# Patient Record
Sex: Female | Born: 1955 | Race: White | Hispanic: No | Marital: Single | Smoking: Never smoker
Health system: Southern US, Community
[De-identification: ages and names within clinical notes are randomized; demographics above are authoritative.]

## PROBLEM LIST (undated history)

## (undated) DIAGNOSIS — T7840XA Allergy, unspecified, initial encounter: Secondary | ICD-10-CM

## (undated) DIAGNOSIS — R7303 Prediabetes: Secondary | ICD-10-CM

## (undated) DIAGNOSIS — R55 Syncope and collapse: Secondary | ICD-10-CM

## (undated) DIAGNOSIS — E669 Obesity, unspecified: Secondary | ICD-10-CM

## (undated) DIAGNOSIS — I10 Essential (primary) hypertension: Secondary | ICD-10-CM

## (undated) DIAGNOSIS — G4730 Sleep apnea, unspecified: Secondary | ICD-10-CM

## (undated) DIAGNOSIS — G4733 Obstructive sleep apnea (adult) (pediatric): Principal | ICD-10-CM

## (undated) DIAGNOSIS — E785 Hyperlipidemia, unspecified: Secondary | ICD-10-CM

## (undated) DIAGNOSIS — J302 Other seasonal allergic rhinitis: Secondary | ICD-10-CM

## (undated) DIAGNOSIS — I2510 Atherosclerotic heart disease of native coronary artery without angina pectoris: Secondary | ICD-10-CM

## (undated) DIAGNOSIS — E039 Hypothyroidism, unspecified: Secondary | ICD-10-CM

## (undated) DIAGNOSIS — J40 Bronchitis, not specified as acute or chronic: Secondary | ICD-10-CM

## (undated) DIAGNOSIS — Z9989 Dependence on other enabling machines and devices: Principal | ICD-10-CM

## (undated) HISTORY — DX: Hypothyroidism, unspecified: E03.9

## (undated) HISTORY — DX: Bronchitis, not specified as acute or chronic: J40

## (undated) HISTORY — DX: Other seasonal allergic rhinitis: J30.2

## (undated) HISTORY — DX: Essential (primary) hypertension: I10

## (undated) HISTORY — DX: Syncope and collapse: R55

## (undated) HISTORY — DX: Allergy, unspecified, initial encounter: T78.40XA

## (undated) HISTORY — DX: Sleep apnea, unspecified: G47.30

## (undated) HISTORY — DX: Dependence on other enabling machines and devices: Z99.89

## (undated) HISTORY — PX: CARDIAC CATHETERIZATION: SHX172

## (undated) HISTORY — DX: Prediabetes: R73.03

## (undated) HISTORY — PX: EYE SURGERY: SHX253

## (undated) HISTORY — PX: CARPAL TUNNEL RELEASE: SHX101

## (undated) HISTORY — DX: Hyperlipidemia, unspecified: E78.5

## (undated) HISTORY — DX: Morbid (severe) obesity due to excess calories: E66.01

## (undated) HISTORY — DX: Obesity, unspecified: E66.9

## (undated) HISTORY — DX: Obstructive sleep apnea (adult) (pediatric): G47.33

## (undated) HISTORY — PX: FOOT SURGERY: SHX648

## (undated) HISTORY — PX: OTHER SURGICAL HISTORY: SHX169

## (undated) HISTORY — PX: COLONOSCOPY: SHX174

## (undated) HISTORY — DX: Atherosclerotic heart disease of native coronary artery without angina pectoris: I25.10

---

## 1998-06-01 ENCOUNTER — Other Ambulatory Visit: Admission: RE | Admit: 1998-06-01 | Discharge: 1998-06-01 | Payer: Self-pay

## 1998-08-30 ENCOUNTER — Encounter: Admission: RE | Admit: 1998-08-30 | Discharge: 1998-09-22 | Payer: Self-pay | Admitting: Neurosurgery

## 1999-10-27 ENCOUNTER — Other Ambulatory Visit: Admission: RE | Admit: 1999-10-27 | Discharge: 1999-10-27 | Payer: Self-pay | Admitting: Obstetrics and Gynecology

## 2001-07-10 ENCOUNTER — Other Ambulatory Visit: Admission: RE | Admit: 2001-07-10 | Discharge: 2001-07-10 | Payer: Self-pay | Admitting: Obstetrics & Gynecology

## 2002-07-25 ENCOUNTER — Other Ambulatory Visit: Admission: RE | Admit: 2002-07-25 | Discharge: 2002-07-25 | Payer: Self-pay | Admitting: Obstetrics & Gynecology

## 2003-01-05 ENCOUNTER — Encounter: Admission: RE | Admit: 2003-01-05 | Discharge: 2003-02-16 | Payer: Self-pay | Admitting: Neurosurgery

## 2003-06-12 ENCOUNTER — Ambulatory Visit (HOSPITAL_COMMUNITY): Admission: RE | Admit: 2003-06-12 | Discharge: 2003-06-12 | Payer: Self-pay | Admitting: Cardiology

## 2007-08-13 ENCOUNTER — Ambulatory Visit: Payer: Self-pay | Admitting: Internal Medicine

## 2007-08-27 ENCOUNTER — Ambulatory Visit: Payer: Self-pay | Admitting: Internal Medicine

## 2007-08-28 ENCOUNTER — Telehealth: Payer: Self-pay | Admitting: Internal Medicine

## 2009-03-30 ENCOUNTER — Ambulatory Visit: Payer: Self-pay | Admitting: Cardiovascular Disease

## 2009-03-30 ENCOUNTER — Telehealth: Payer: Self-pay | Admitting: Cardiology

## 2009-03-30 DIAGNOSIS — R0789 Other chest pain: Secondary | ICD-10-CM

## 2009-03-31 ENCOUNTER — Ambulatory Visit: Payer: Self-pay | Admitting: Cardiovascular Disease

## 2009-03-31 ENCOUNTER — Inpatient Hospital Stay (HOSPITAL_BASED_OUTPATIENT_CLINIC_OR_DEPARTMENT_OTHER): Admission: RE | Admit: 2009-03-31 | Discharge: 2009-03-31 | Payer: Self-pay | Admitting: Cardiovascular Disease

## 2009-04-12 ENCOUNTER — Ambulatory Visit: Payer: Self-pay | Admitting: Cardiovascular Disease

## 2009-04-12 DIAGNOSIS — I10 Essential (primary) hypertension: Secondary | ICD-10-CM

## 2009-04-12 DIAGNOSIS — I2510 Atherosclerotic heart disease of native coronary artery without angina pectoris: Secondary | ICD-10-CM

## 2009-06-01 ENCOUNTER — Encounter: Admission: RE | Admit: 2009-06-01 | Discharge: 2009-06-01 | Payer: Self-pay | Admitting: Family Medicine

## 2009-08-04 ENCOUNTER — Ambulatory Visit: Payer: Self-pay | Admitting: Cardiovascular Disease

## 2009-08-11 LAB — CONVERTED CEMR LAB
Alkaline Phosphatase: 54 units/L (ref 39–117)
Bilirubin, Direct: 0.1 mg/dL (ref 0.0–0.3)
LDL Cholesterol: 72 mg/dL (ref 0–99)
Total Bilirubin: 0.4 mg/dL (ref 0.3–1.2)
Total CHOL/HDL Ratio: 2
Triglycerides: 33 mg/dL (ref 0.0–149.0)
VLDL: 6.6 mg/dL (ref 0.0–40.0)

## 2009-12-01 ENCOUNTER — Ambulatory Visit: Payer: Self-pay | Admitting: Cardiovascular Disease

## 2010-03-13 LAB — CONVERTED CEMR LAB
BUN: 13 mg/dL (ref 6–23)
Basophils Absolute: 0.1 10*3/uL (ref 0.0–0.1)
Calcium: 9.2 mg/dL (ref 8.4–10.5)
Chloride: 105 meq/L (ref 96–112)
Creatinine, Ser: 0.76 mg/dL (ref 0.40–1.20)
Eosinophils Absolute: 0.3 10*3/uL (ref 0.0–0.7)
Eosinophils Relative: 4 % (ref 0–5)
Hemoglobin: 13.7 g/dL (ref 12.0–15.0)
Lymphocytes Relative: 40 % (ref 12–46)
MCV: 89.2 fL (ref 78.0–100.0)
Platelets: 261 10*3/uL (ref 150–400)
Prothrombin Time: 12.5 s (ref 11.6–15.2)
RBC: 4.41 M/uL (ref 3.87–5.11)
RDW: 12.4 % (ref 11.5–15.5)
WBC: 7.9 10*3/uL (ref 4.0–10.5)

## 2010-03-17 NOTE — Assessment & Plan Note (Signed)
Summary: left arm and jaw pain, chest discomfort/lwb   Visit Type:  Follow-up Primary Provider:  Dr. Georgina Pillion  CC:  chest pain. Severity of pain 7. Pt takes an Advil to ease pain which helps she states.  History of Present Illness: 55 yo WF with history of HTN and hypothyroidism who began to have left sided and substernal chest pressure two days ago. There was associated nausea and radiation of the pain into the left arm. The arm pain waxed and wained, but the chest discomfort was continuous. She has had on and off chest pressure over the last 36 hours. There has been some jaw pain. She has had no upper respiratory symptoms and denies fever, chills, rigors. No sick contacts but she is exposed to hundreds of children at school. She has been sleeping well at night. She also worked today but has had chest pain over the course of the entire day.   Current Medications (verified): 1)  Levothroid 137 Mcg Tabs (Levothyroxine Sodium) .Marland Kitchen.. 1 Tab By Mouth Once Daily 2)  Lisinopril-Hydrochlorothiazide 10-12.5 Mg Tabs (Lisinopril-Hydrochlorothiazide) .... Tab By Mouth Once Daily 3)  Flonase 50 Mcg/act Susp (Fluticasone Propionate) .... As Needed 4)  Claritin 10 Mg Tabs (Loratadine) .... As Needed 5)  Etodolac 500 Mg Tabs (Etodolac) .... As Needed  Allergies: 1)  ! 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  The patient denies fatigue, malaise, fever, weight gain/loss, vision loss, decreased hearing, hoarseness, palpitations, shortness of breath,  prolonged cough, wheezing, sleep apnea, coughing up blood, abdominal pain, blood in stool, vomiting, diarrhea, heartburn, incontinence, blood in urine, muscle weakness, joint pain, leg swelling, rash, skin lesions, headache, fainting, dizziness, depression, anxiety, enlarged lymph nodes, easy bruising or bleeding, and environmental allergies.    Vital Signs:  Patient profile:   55 year old female Height:      64 inches Weight:      203 pounds BMI:     34.97 Pulse rate:   67 / minute Resp:     14 per minute BP sitting:   132 / 75  (left arm)  Vitals Entered By: Kem Parkinson (March 30, 2009 4:26 PM)  Physical Exam  General:  General: Well developed, well nourished, NAD HEENT: OP clear, mucus membranes moist SKIN: warm, dry Neuro: No focal deficits Musculoskeletal: Muscle strength 5/5 all ext Psychiatric: Mood and affect normal Neck: No JVD, no carotid bruits, no thyromegaly, no lymphadenopathy. Lungs:Clear bilaterally, no wheezes, rhonci, crackles CV: RRR no murmurs, gallops rubs Abdomen: soft, NT, ND, BS present Extremities: No edema, pulses 2+.    EKG  Procedure date:  03/30/2009  Findings:      NSR, rate 67 bpm. Normal EKG.   Impression & Recommendations:  Problem # 1:  CHEST DISCOMFORT (ICD-786.59) Her chest pain is worrisome for unstable angina. She has  a personal history of HTN and is overweight. She has a strong family history of CAD. Given her classic symptoms and risk factors, I will proceed to a diagnostic left heart cath tomorrow in the outpatient cardiac cath lab. We will  check BMET, CBC, coags today. She is aware of the risks and benefits of the procedure.   Her updated medication list for this problem includes:    Lisinopril-hydrochlorothiazide 10-12.5 Mg Tabs (Lisinopril-hydrochlorothiazide) .Marland Kitchen... Tab by mouth once daily  Orders: EKG w/ Interpretation (93000) TLB-BMP (Basic Metabolic Panel-BMET) (80048-METABOL) TLB-CBC Platelet - w/Differential  (85025-CBCD) TLB-PT (Protime) (85610-PTP) TLB-PTT (85730-PTTL) Cardiac Catheterization (Cardiac Cath)  Patient Instructions: 1)  Your physician recommends that you schedule a follow-up appointment in: 2 weeks 2)  Your physician has requested that you have a cardiac catheterization.  Cardiac catheterization is used to diagnose and/or treat various heart conditions. Doctors may recommend this procedure for a number of different reasons. The most common reason is to evaluate chest pain. Chest pain can be a symptom of coronary artery disease (CAD), and cardiac catheterization can show whether plaque is narrowing or blocking your heart's arteries. This procedure is also used to evaluate the valves, as well as measure the blood flow and oxygen levels in different parts of your heart.  For further information please visit [email or web address]  Please follow instruction sheet, as given.

## 2010-03-17 NOTE — Letter (Signed)
Summary: Cardiac Catheterization Instructions- [facility]  [facility], Main Office  [address]   [place], [place] [address]   Phone: [phone]  Fax: [phone]     03/30/2009 MRN: [id number]  Gina Lara [address] [place], [place]  [address]  Dear Ms. Gina Lara,   You are scheduled for a Cardiac Catheterization on ___February 16, 2011 with Dr.Elizbeth Posa  Please arrive to the 1st floor of the Heart and Vascular Center at [facility] at _10:30 am  on the day of your procedure. Please do not arrive before 6:30 a.m. Call the Heart and Vascular Center at [phone] if you are unable to make your appointmnet. The Code to get into the parking garage under the building is 0900. Take the elevators to the 1st floor. You must have someone to drive you home. Someone must be with you for the first 24 hours after you arrive home. Please wear clothes that are easy to get on and off and wear slip-on shoes. Do not eat or drink after midnight except water with your medications that morning. Bring all your medications and current insurance cards with you.  ___ DO NOT take these medications before your procedure: ________________________________________________________________  ___ Make sure you take your aspirin.  _x__ You may take ALL of your medications with water that morning. ________________________________________________________________________________________________________________________________  ___ DO NOT take ANY medications before your procedure.  ___ Pre-med instructions:  ________________________________________________________________________________________________________________________________  The usual length of stay after your procedure is 2 to 3 hours. This can vary.  If you have any questions, please call the office at the number listed above.   Dossie Arbour, RN, BSN

## 2010-03-17 NOTE — Progress Notes (Signed)
Summary: chest discomfort  Phone Note Call from Patient Call back at [facility] Phone [phone] Call back at cell- [phone]-  school # [phone]   Caller: Patient Reason for Call: Talk to Nurse Details for Reason: Per pt calling,seen ts in 05, c/o chest discomfort, indigestion, arm pain since sunday. some jaw discomfort but not much. not keeping pt up. inform pt that i would seen an important message to nurse.  Initial call taken by: Lorne Skeens,  March 30, 2009 11:35 AM  Follow-up for Phone Call        Left message on cell phone.   Pt called back and states she ate lunch on Sunday and then developed pain in the center of her chest which she thought was indigestion.  She was nauseated the rest of the day and had difficulty sleeping Sunday night.  The pt did go for  a 40 minute walk on Sunday and her symptoms did not change when she exerted herself.  The pt said yesterday and today she has continued to have some nausea, chest discomfort, left arm pain and jaw pain.  The pt's chest discomfort continues to come and go and is located in the center of her chest.   The pt describes this discomfort as a squeezing and pressure.  The pt rates her worst discomfort as a 7/10. The pt has been using Pepto Bismol and Tums OTC without much change in her symptoms. The pt does not notice any change in her symptoms with movement or inspiration.  When the pt presses her chest her symptoms are "a little" worse. Pt denies SOB.  The pt states these are the same symptoms that she was evaluated for in 2005 with the exception of jaw pain which is new. The pt said she has had episodes of chest discomfort and left arm pain for the past 10 years.  The pt did see her PCP Dr Georgina Pillion last week at [facility] (retired last week) and everything was okay.  I discussed this pt with Dr Riley Kill and he would like the pt to see one of his partners today in the office.  The pt has been added to Dr Gibson Ramp schedule today at  4:15. Follow-up by: Julieta Gutting, RN, BSN,  March 30, 2009 12:44 PM  Additional Follow-up for Phone Call Additional follow up Details #1::        Pt aware of appt. Additional Follow-up by: Julieta Gutting, RN, BSN,  March 30, 2009 1:10 PM

## 2010-03-17 NOTE — Assessment & Plan Note (Signed)
Summary: per check out/sf   Visit Type:  6 mo f/u Primary Gina Lara:  Dr. Georgina Pillion   History of Present Illness: 55 yo WF with history of HTN and hypothyroidism who was seen as a new patient on 03/30/09. She had been seen by Dr. Riley Kill remotely. At the first visit, she complained of left sided and substernal chest pressure for two days. There was associated nausea and radiation of the pain into the left arm.  Because of her strong family history, risk factors and story, I arranged a diagnostic left heart cath on 03/31/09. This showed normal LV function with mild non-obstructive CAD.  She is here today for follow up. She has been doing well. No chest pain, SOB. She has gained 15 lbs over last few months. She is now up to 212 lbs. She is not happy about this.   Current Medications (verified): 1)  Levothroid 137 Mcg Tabs (Levothyroxine Sodium) .Marland Kitchen.. 1 Tab By Mouth Once Daily 2)  Lisinopril-Hydrochlorothiazide 10-12.5 Mg Tabs (Lisinopril-Hydrochlorothiazide) .... Tab By Mouth Once Daily 3)  Claritin 10 Mg Tabs (Loratadine) .Marland Kitchen.. 1 Tab Once Daily 4)  Etodolac 500 Mg Tabs (Etodolac) .... As Needed 5)  Aspirin 81 Mg Tbec (Aspirin) .... Take One Tablet By Mouth Daily 6)  Crestor 10 Mg Tabs (Rosuvastatin Calcium) .Marland Kitchen.. 1 Tab At Bedtime 7)  Tylenol Extra Strength 500 Mg Tabs (Acetaminophen) .Marland Kitchen.. 1-2 Tab Q 6 Hour As Needed  Allergies: 1)  ! Amoxicillin  Past History:  Past Medical History: Reviewed history from 04/12/2009 and no changes required. HTN Hypothyroidism CAD-mild non-osbstructive disease by cath 2/11. Seasonal allergies  Social History: Reviewed history from 03/30/2009 and no changes required. 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    Vital Signs:  Patient profile:   55 year old female Height:      64 inches Weight:      211.8 pounds BMI:     36.49 Pulse rate:   64 / minute Pulse rhythm:   regular BP sitting:   122 / 64  (left arm) Cuff size:   large  Vitals Entered By: Danielle Rankin, CMA (December 01, 2009 4:27 PM)  Physical Exam  General:  General: Well developed, well nourished, NAD Musculoskeletal: Muscle strength 5/5 all ext Psychiatric: Mood and affect normal Neck: No JVD,  no thyromegaly, no lymphadenopathy. Lungs:Clear bilaterally, no wheezes, rhonci, crackles CV: RRR no murmurs, gallops rubs Abdomen: soft, NT, ND, BS present Extremities: No edema, pulses 2+.    Impression & Recommendations:  Problem # 1:  CAD, NATIVE VESSEL (ICD-414.01) Mild disease by cath February 2011. She is on ASA and a statin. Doing well. No chest pain. Weight loss plan and exercise reviewed today. She is joining a gym this weekend. Her goal weight at return in one year is 180 pounds.   Her updated medication list for this problem includes:    Lisinopril-hydrochlorothiazide 10-12.5 Mg Tabs (Lisinopril-hydrochlorothiazide) .Marland Kitchen... Tab by mouth once daily    Aspirin 81 Mg Tbec (Aspirin) .Marland Kitchen... Take one tablet by mouth  daily  Problem # 2:  HYPERTENSION, BENIGN (ICD-401.1) BP well controlled.   Her updated medication list for this problem includes:    Lisinopril-hydrochlorothiazide 10-12.5 Mg Tabs (Lisinopril-hydrochlorothiazide) .Marland Kitchen... Tab by mouth once daily    Aspirin 81 Mg Tbec (Aspirin) .Marland Kitchen... Take one tablet by mouth daily  Patient Instructions: 1)  Your physician  recommends that you schedule a follow-up appointment in: 1 year 2)  Your physician recommends that you continue on your current medications as directed. Please refer to the Current Medication list given to you today.

## 2010-03-17 NOTE — Miscellaneous (Signed)
Summary: Orders Update  Clinical Lists Changes  Orders: Added new Test order of T-Basic Metabolic Panel (80048-22910) - Signed Added new Test order of T-CBC w/Diff (85025-10010) - Signed Added new Test order of T-PTT (85730-22010) - Signed Added new Test order of T-Protime, Auto (85610-22000) - Signed 

## 2010-03-17 NOTE — Assessment & Plan Note (Signed)
Summary: per check out/sf   Visit Type:  Follow-up Primary Provider:  Dr. Georgina Pillion  CC:  pt states Gina Lara is still having some cp after eating but says not like before..denies any sob or edema.  History of Present Illness: Gina Lara with history of HTN and hypothyroidism who was seen as a new patient on 03/30/09. Gina Lara had been seen by Dr. Riley Kill remotely. At the first visit, Gina Lara complained of left sided and substernal chest pressure for two days. There was associated nausea and radiation of the pain into the left arm. The arm pain waxed and wained, but the chest discomfort was continuous. There has been some jaw pain. Gina Lara had no upper respiratory symptoms and denied fever, chills, rigors. No sick contacts but Gina Lara is exposed to hundreds of children at school. Gina Lara has been sleeping well at night.   Because of her strong family history, risk factors and story, I arranged a diagnostic left heart cath on 03/31/09. This showed normal LV function with mild non-obstructive CAD. Gina Lara tells me that her pain is much improved. Occasionally, Gina Lara will have arm pain and chest pain after meals. This is sometimes associated with stress. Gina Lara has lost 13 pounds over the last 2 months. Gina Lara is eager to begin eating healthy and exercising.   Current Medications (verified): 1)  Levothroid 137 Mcg Tabs (Levothyroxine Sodium) .Marland Kitchen.. 1 Tab By Mouth Once Daily 2)  Lisinopril-Hydrochlorothiazide 10-12.5 Mg Tabs (Lisinopril-Hydrochlorothiazide) .... Tab By Mouth Once Daily 3)  Flonase 50 Mcg/act Susp (Fluticasone Propionate) .... As Needed 4)  Claritin 10 Mg Tabs (Loratadine) .Marland Kitchen.. 1 Tab Once Daily 5)  Etodolac 500 Mg Tabs (Etodolac) .... As Needed 6)  Aspirin 81 Mg Tbec (Aspirin) .... Take One Tablet By Mouth Daily 7)  Crestor 10 Mg Tabs (Rosuvastatin Calcium) .Marland Kitchen.. 1 Tab At Bedtime 8)  Pepcid 20 Mg Tabs (Famotidine) .Marland Kitchen.. 1 Tab Once Daily  Allergies: 1)  ! Amoxicillin  Past History:  Past Medical  History: HTN Hypothyroidism CAD-mild non-osbstructive disease by cath 2/11. Seasonal allergies  Social History: Reviewed history from 03/30/2009 and no changes required. 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  The patient denies fatigue, malaise, fever, weight gain/loss, vision loss, decreased hearing, hoarseness, palpitations, shortness of breath, prolonged cough, wheezing, sleep apnea, coughing up blood, abdominal pain, blood in stool, nausea, vomiting, diarrhea, heartburn, incontinence, blood in urine, muscle weakness, joint pain, leg swelling, rash, skin lesions, headache, fainting, dizziness, depression, anxiety, enlarged lymph nodes, easy bruising or bleeding, and environmental allergies.    Vital Signs:  Patient profile:   Gina year old female Height:      64 inches Weight:      201 pounds BMI:     34.63 Pulse rate:   76 / minute Pulse rhythm:   regular BP sitting:   110 / 70  (left arm) Cuff size:   large  Vitals Entered By: Danielle Rankin, CMA (April 12, 2009 4:15 PM)  Physical Exam  General:  General: Well developed, well nourished, NAD Psychiatric: Mood and affect normal Neck: No JVD, no carotid bruits, no thyromegaly, no lymphadenopathy. Lungs:Clear bilaterally, no wheezes, rhonci, crackles CV: RRR no murmurs, gallops rubs Abdomen: soft, NT, ND, BS present Extremities: No edema, pulses 2+. Right radial pulse 2+. Right radial cath site clean, well healed. No hematoma.  Cardiac Cath  Procedure date:  03/31/2009  Findings:      1. The left main coronary artery bifurcated into the circumflex and     LAD and had no evidence of disease. 2. The left anterior descending was a moderate-sized vessel that     coursed to the apex and gave off a moderate-sized bifurcating     diagonal branch.  There appeared to be mild  luminal irregularities     throughout the left anterior descending artery.  There appeared to     be a 20% proximal stenosis and 20% serial mid stenosis.  The distal     apical LAD appeared to have diffuse 30% disease.  The diagonal     branch was moderate size and had no obstructive disease. 3. The circumflex artery was a large-caliber vessel that gave off a     small caliber first obtuse marginal branch that was free of     disease.  The mid AV groove circumflex took an acute bend and     appeared to have a 30% stenosis prior to the bend.  Second obtuse     marginal was moderate sized and bifurcating and had no obstructive     disease.  The left-sided posterior descending artery was small     caliber and had no obstructive disease. 4. The right coronary artery was a small nondominant vessel that     appeared to have a 40-50% stenosis in the midportion of the vessel.     This was a very small-caliber vessel. 5. Left ventricular angiogram was performed in the RAO projection and     showed normal left ventricular systolic function with ejection     fraction of 65%.  Impression & Recommendations:  Problem # 1:  CAD, NATIVE VESSEL (ICD-414.01) Mild disease by cath 03/31/09. Chest pain is most likely non-cardiac based on the minimal disease seen during cath. Will continue statin, ace-inhibitor and ASA. Gina Lara will continue Pepcid for possible GERD. Consider PPI in the future if symptoms continue. Gina Lara will begin exercising daily. Our goal for her weight is 175 lbs at the next visit in 6 months.  Will check FLP, LFTs and CK in 10 weeks (Crestor started 03/31/09)  Her updated medication list for this problem includes:    Lisinopril-hydrochlorothiazide 10-12.5 Mg Tabs (Lisinopril-hydrochlorothiazide) .Marland Kitchen... Tab by mouth once daily    Aspirin 81 Mg Tbec (Aspirin) .Marland Kitchen... Take one tablet by mouth daily  Problem # 2:  HYPERTENSION, BENIGN (ICD-401.1) Well controlled on current therapy.   Her updated  medication list for this problem includes:    Lisinopril-hydrochlorothiazide 10-12.5 Mg Tabs (Lisinopril-hydrochlorothiazide) .Marland Kitchen... Tab by mouth once daily    Aspirin 81 Mg Tbec (Aspirin) .Marland Kitchen... Take one tablet by mouth daily  Patient Instructions: 1)  Your physician recommends that you schedule a follow-up appointment in: 6 months 2)  Your physician recommends that you return for a FASTING lipid profile, liver function and CK in mid May. (May 18)

## 2010-05-04 ENCOUNTER — Other Ambulatory Visit: Payer: Self-pay | Admitting: *Deleted

## 2010-05-04 DIAGNOSIS — E785 Hyperlipidemia, unspecified: Secondary | ICD-10-CM

## 2010-05-04 MED ORDER — ROSUVASTATIN CALCIUM 10 MG PO TABS: 10.0000 mg | ORAL_TABLET | Freq: Every day | ORAL | Status: DC

## 2010-08-26 ENCOUNTER — Telehealth: Payer: Self-pay | Admitting: Internal Medicine

## 2010-08-29 ENCOUNTER — Encounter: Payer: Self-pay | Admitting: Internal Medicine

## 2010-08-29 ENCOUNTER — Encounter: Payer: Self-pay | Admitting: *Deleted

## 2010-08-29 NOTE — Telephone Encounter (Signed)
Offered an appointment on 09/12/10 but patient cannot come for OV until after 09/26/10. Scheduled patient on 09/28/10 at 10:15AM. Sue Lush aware and will notify patient. Letter mailed to patient.

## 2010-08-29 NOTE — Telephone Encounter (Signed)
Error

## 2010-09-12 ENCOUNTER — Ambulatory Visit: Payer: Self-pay | Admitting: Internal Medicine

## 2010-09-28 ENCOUNTER — Ambulatory Visit (INDEPENDENT_AMBULATORY_CARE_PROVIDER_SITE_OTHER): Payer: BC Managed Care – PPO | Admitting: Internal Medicine

## 2010-09-28 ENCOUNTER — Encounter: Payer: Self-pay | Admitting: Internal Medicine

## 2010-09-28 VITALS — BP 100/62 | HR 88 | Ht 64.0 in | Wt 296.0 lb

## 2010-09-28 DIAGNOSIS — R933 Abnormal findings on diagnostic imaging of other parts of digestive tract: Secondary | ICD-10-CM

## 2010-09-28 NOTE — Patient Instructions (Signed)
Dr Genia Del

## 2010-09-28 NOTE — Progress Notes (Signed)
Margarette Vannatter [place] 23-Jun-1955 MRN [id number]     History of Present Illness:  This is a 55 year old white female who was found to have a nodule on the rectal exam by [facility] during her routine gynecological exam. She denies any change in bowel habits, rectal bleeding or rectal pain. We did a screening colonoscopy in July 2009 which was a normal exam. Her recall colonoscopy will be in July 2019. She is just getting over acute gastroenteritis precipitated by antibiotics including erythromycin in combination with Ibuprofen  for an ear infection . She was having nausea, vomiting and abdominal pain but she almost 100% improved now.   Past Medical History  Diagnosis Date  . Hypertension   . Hypothyroidism   . Hyperlipidemia   . CAD (coronary artery disease)     mild, non-obstructive  . Seasonal allergies   . Obesity    Past Surgical History  Procedure Date  . Foot surgery   . Eye surgery   . Herniated disk     reports that she has never smoked. She has never used smokeless tobacco. She reports that she drinks alcohol. She reports that she does not use illicit drugs. family history includes Heart attack in her brother, father, and mother. Allergies  Allergen Reactions  . Amoxicillin     REACTION: rash        Review of Systems:  The remainder of the 10  point ROS is negative except as outlined in H&P   Physical Exam: General appearance  Well developed in no distress. Eyes- non icteric. HEENT nontraumatic, normocephalic. Mouth no lesions, tongue papillated, no cheilosis. Neck supple without adenopathy, thyroid not enlarged, no carotid bruits, no JVD. Lungs Clear to auscultation bilaterally. Cor normal S1 normal S2, regular rhythm , no murmur,  quiet precordium. Abdomen Soft obese abdomen. Normal active bowel sounds. No distention. No tenderness.  Rectal:and anoscopic exam reveals small hemorrhoidal tags externally. Normal rectal sphincter tone. Several hypertrophied anal  papillae at dentate line; each measures about 3 mm in length, and feels very firm on digital exam. They don't protrude through the rectum and they are not tender and don't show any stigmata of bleeding The rectal ampulla itself appears normal and there are  no internal hemorrhoids. Stool is Hemoccult negative. Extremities no pedal edema. Skin no lesions. Neurological alert and oriented x 3. Psychological normal mood and affect.  Assessment and Plan:  Problem #1 Rectal nodules within the anal canal at the dentate line represent hypertrophied anal papillae. They are not excessively enlarged and they do not seem to be causing any symptoms. There are no significant hemorrhoids or any other lesions. Stool is Hemoccult negative. I have discussed these findings with the patient and I feel comfortable keeping the recall colonoscopy for a 10 year recall in 2019. We will be happy to see her before that if she develop specific problems.   09/28/2010 Lina Sar

## 2011-05-16 ENCOUNTER — Other Ambulatory Visit: Payer: Self-pay | Admitting: Cardiovascular Disease

## 2011-08-08 IMAGING — CT CT HEAD W/O CM
2 series · 16 of 30 positions shown, 20 images · non-contrast
Comparison: None.

CLINICAL DATA: Fall injury with headache, dizziness, slurred
speech.

CT HEAD WITHOUT CONTRAST
TECHNIQUE: Contiguous axial images were obtained from the base of
the skull through the vertex without contrast.

[Series 2: head w/o · axial · non-contrast · 0.49mm/px · z∈[+14,+144]mm · 13 of 28 slices shown, 17 images]
[im 2/28  brain]
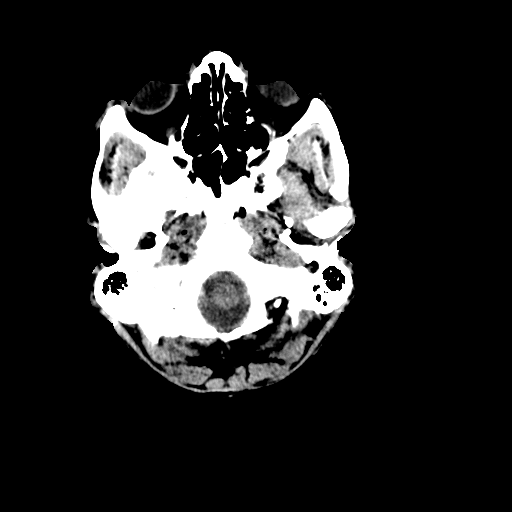
[im 2/28  bone]
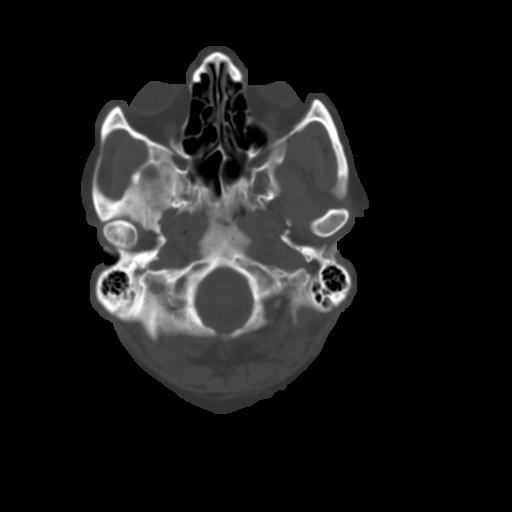
[im 4/28  brain]
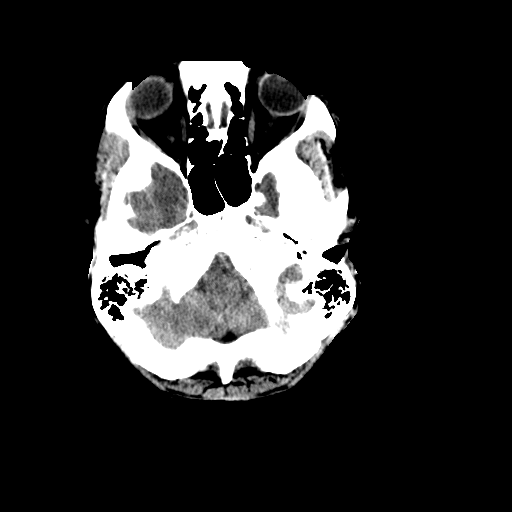
[im 6/28  brain]
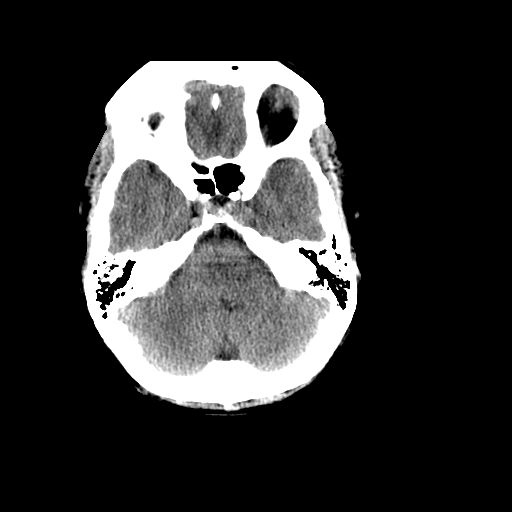
[im 8/28  brain]
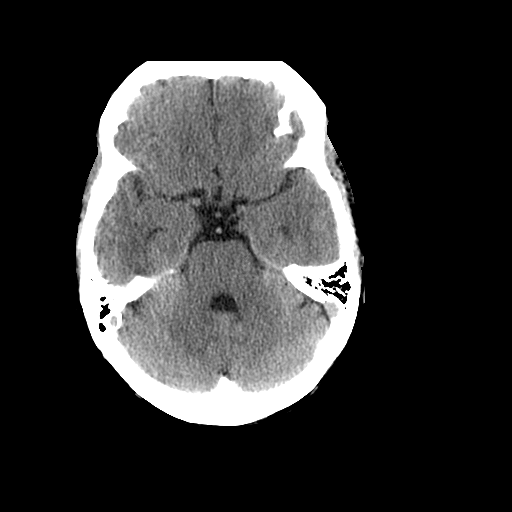
[im 10/28  brain]
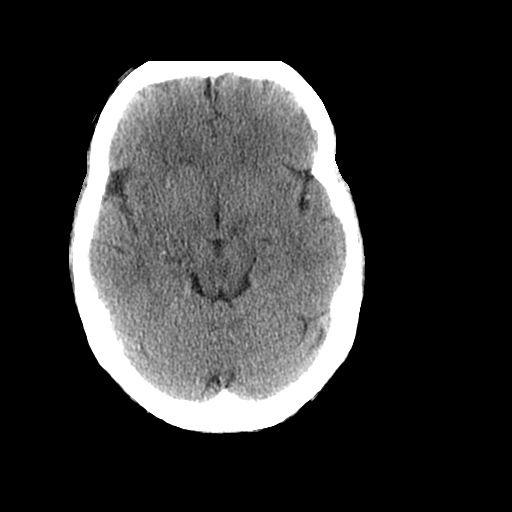
[im 10/28  bone]
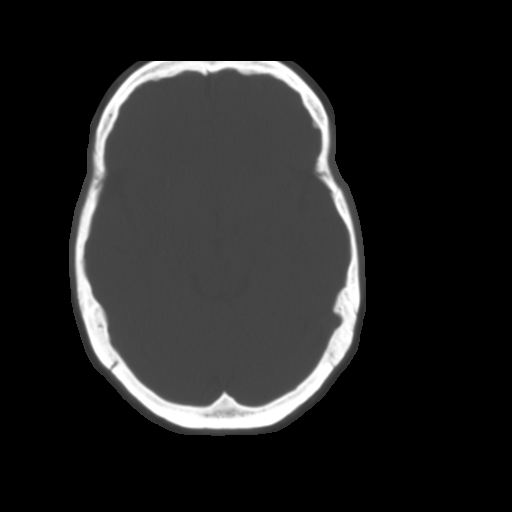
[im 12/28  brain]
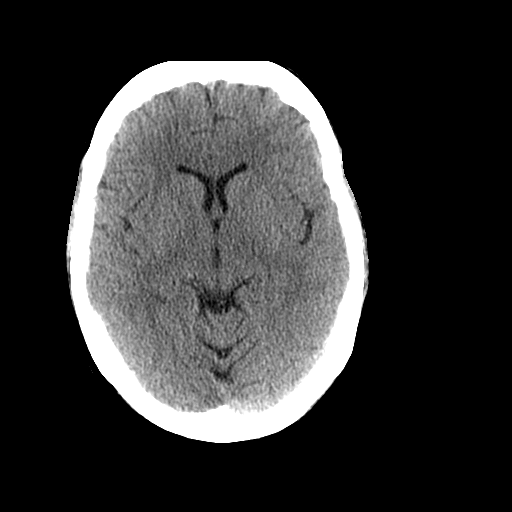
[im 14/28  brain]
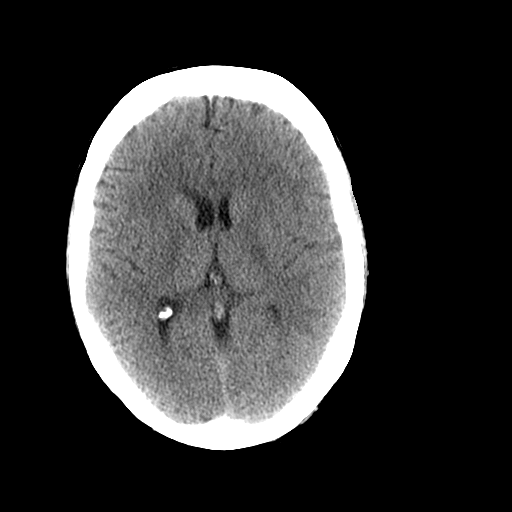
[im 16/28  brain]
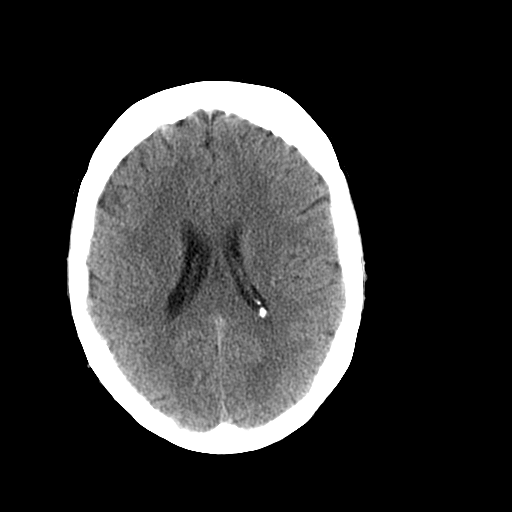
[im 18/28  brain]
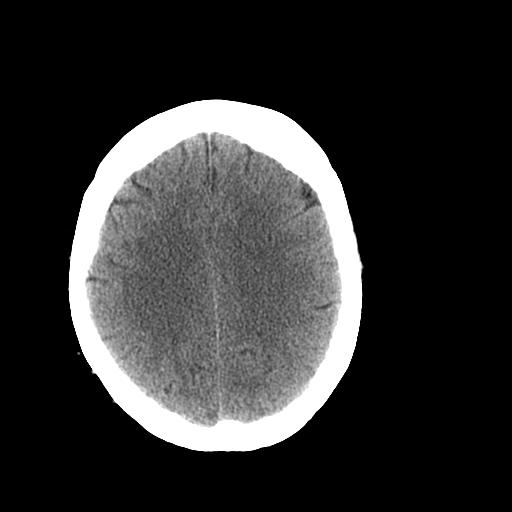
[im 18/28  bone]
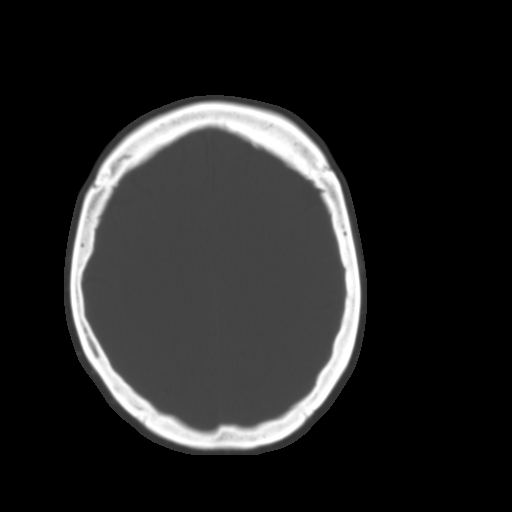
[im 20/28  brain]
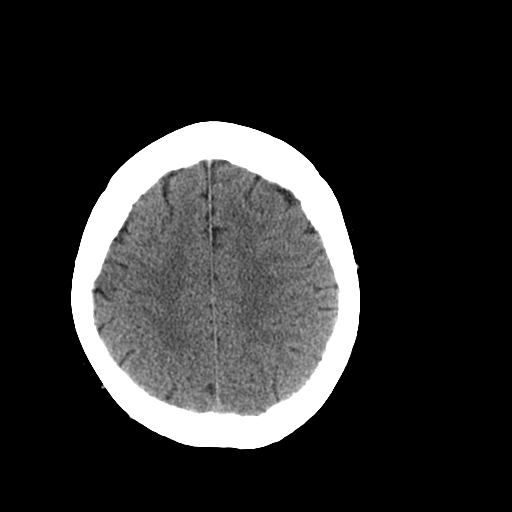
[im 22/28  brain]
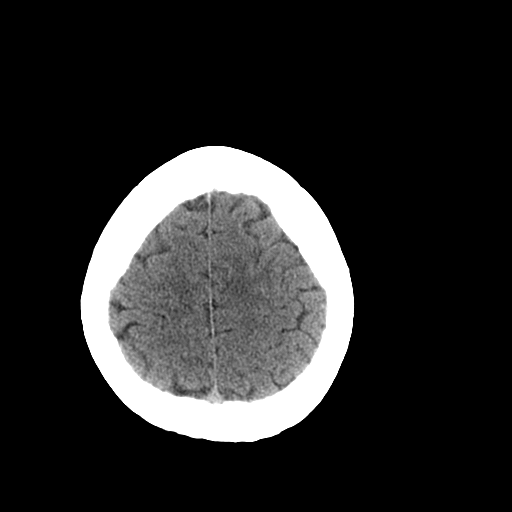
[im 24/28  brain]
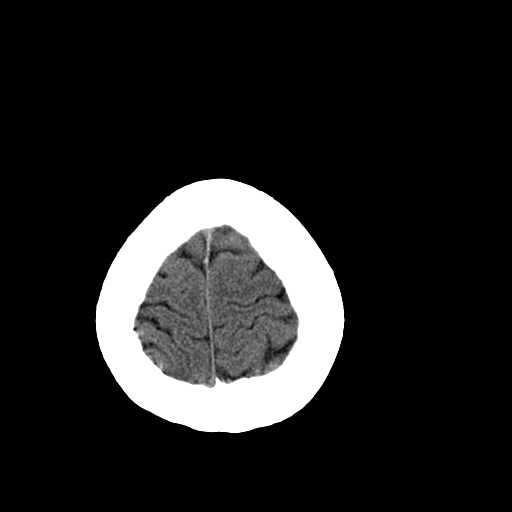
[im 26/28  brain]
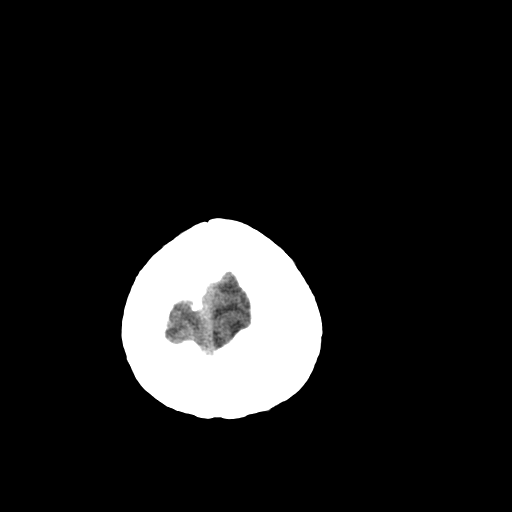
[im 26/28  bone]
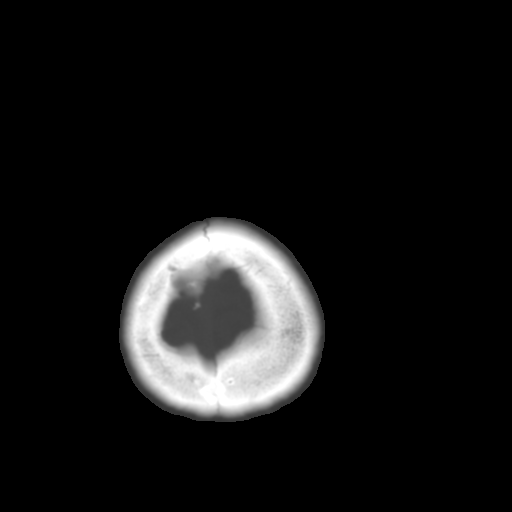

[Series 3: head bone · axial · 0.49mm/px · z∈[+14,+57]mm · 3 of 28 slices shown]
[im 2/28  bone]
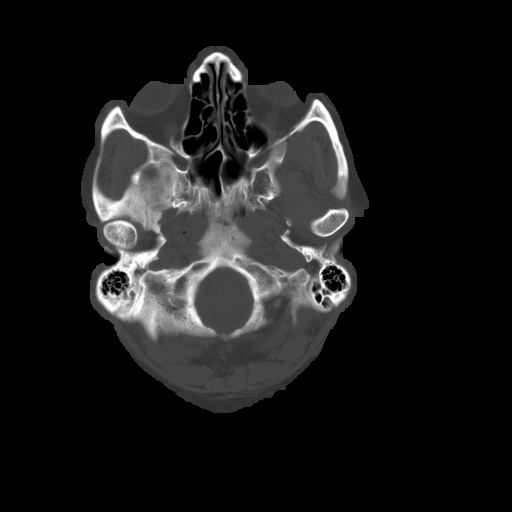
[im 6/28  bone]
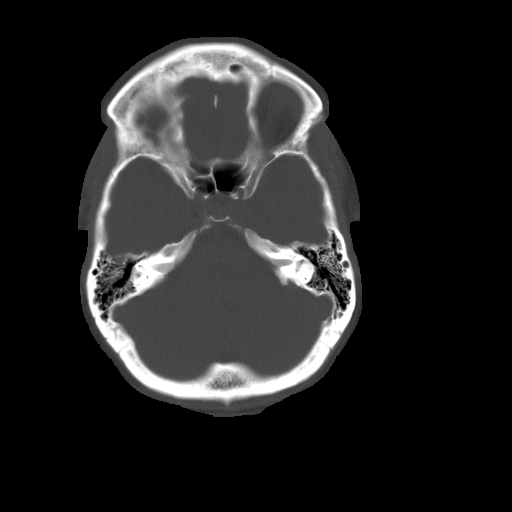
[im 10/28  bone]
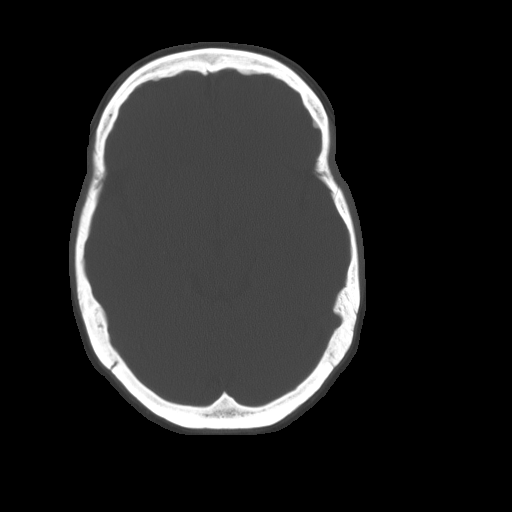

[16 of 30 positions shown; findings below may reference images not displayed]

FINDINGS: The skull appears normal with clear paranasal sinuses and
bilateral mastoid air cells without acute fracture.  Cerebrum,
cerebral ventricles, and cerebellum appear normal for age.  No
acute stroke, acute intracerebral edema, acute intracerebral
hemorrhage, mass lesion, mass effect, acute extra-axial
hemorrhage/collection/tumor seen.
IMPRESSION: Negative.

## 2011-08-19 ENCOUNTER — Other Ambulatory Visit: Payer: Self-pay | Admitting: Cardiovascular Disease

## 2011-08-21 NOTE — Telephone Encounter (Signed)
Pt needs appointment then refill can be made Fax Received. Refill Completed. Gina Lara (R.M.A)   

## 2011-10-18 ENCOUNTER — Telehealth: Payer: Self-pay | Admitting: Cardiovascular Disease

## 2011-10-18 DIAGNOSIS — I2510 Atherosclerotic heart disease of native coronary artery without angina pectoris: Secondary | ICD-10-CM

## 2011-10-18 NOTE — Telephone Encounter (Signed)
Left message to call back  

## 2011-10-18 NOTE — Telephone Encounter (Signed)
Spoke with pt. She last saw Dr. Clifton James in October 2011 and has upcoming appt scheduled with him for December 01, 2011. She has routine lab work done by primary MD but pt states primary MD prefers she have cholesterol checked by her cardiologist.  Appt made for her to have fasting lipid and liver profile on November 27, 2011

## 2011-10-18 NOTE — Telephone Encounter (Signed)
Pt calling to see if labs need to be done prior to appt

## 2011-10-20 ENCOUNTER — Other Ambulatory Visit: Payer: Self-pay | Admitting: Cardiovascular Disease

## 2011-10-26 ENCOUNTER — Ambulatory Visit: Payer: BC Managed Care – PPO | Admitting: Cardiovascular Disease

## 2011-11-27 ENCOUNTER — Other Ambulatory Visit: Payer: BC Managed Care – PPO

## 2011-12-01 ENCOUNTER — Ambulatory Visit: Payer: BC Managed Care – PPO | Admitting: Cardiovascular Disease

## 2011-12-11 ENCOUNTER — Other Ambulatory Visit (INDEPENDENT_AMBULATORY_CARE_PROVIDER_SITE_OTHER): Payer: BC Managed Care – PPO

## 2011-12-11 DIAGNOSIS — I2510 Atherosclerotic heart disease of native coronary artery without angina pectoris: Secondary | ICD-10-CM

## 2011-12-11 LAB — LIPID PANEL
HDL: 46.4 mg/dL (ref >39.00)
LDL Cholesterol: 84 mg/dL (ref 0–99)
Total CHOL/HDL Ratio: 3
Triglycerides: 64 mg/dL (ref 0.0–149.0)
VLDL: 12.8 mg/dL (ref 0.0–40.0)

## 2011-12-11 LAB — HEPATIC FUNCTION PANEL
AST: 18 U/L (ref 0–37)
Total Bilirubin: 0.8 mg/dL (ref 0.3–1.2)

## 2012-01-09 ENCOUNTER — Ambulatory Visit (INDEPENDENT_AMBULATORY_CARE_PROVIDER_SITE_OTHER): Payer: BC Managed Care – PPO | Admitting: Cardiovascular Disease

## 2012-01-09 ENCOUNTER — Encounter: Payer: Self-pay | Admitting: Cardiovascular Disease

## 2012-01-09 VITALS — BP 124/81 | HR 66 | Ht 64.0 in | Wt 210.0 lb

## 2012-01-09 DIAGNOSIS — I2510 Atherosclerotic heart disease of native coronary artery without angina pectoris: Secondary | ICD-10-CM

## 2012-01-09 MED ORDER — ROSUVASTATIN CALCIUM 10 MG PO TABS: 10.0000 mg | ORAL_TABLET | Freq: Every day | ORAL | Status: DC

## 2012-01-09 NOTE — Patient Instructions (Addendum)
Your physician wants you to follow-up in:  6 months. You will receive a reminder letter in the mail two months in advance. If you don't receive a letter, please call our office to schedule the follow-up appointment.   

## 2012-01-09 NOTE — Progress Notes (Signed)
History of Present Illness: 56 yo WF with history of HTN and hypothyroidism who was seen as a new patient on 03/30/09. She had been seen by Dr. Riley Kill remotely. At the first visit, she complained of left sided and substernal chest pressure for two days. There was associated nausea and radiation of the pain into the left arm. Because of her strong family history, risk factors and story, I arranged a diagnostic left heart cath on 03/31/09. This showed normal LV function with mild non-obstructive CAD. She was last seen in our office 12/01/09.   She is here today for follow up. She has been doing well. No chest pain, SOB. She wishes to start exercising. She is trying to eat healthy. She teaches art for elementary school. Going to [place] next month.   Primary Care Physician: Koirala,Dibas  Last Lipid Profile:Lipid Panel     Component Value Date/Time   CHOL 143 12/11/2011 1157   TRIG 64.0 12/11/2011 1157   HDL 46.40 12/11/2011 1157   CHOLHDL 3 12/11/2011 1157   VLDL 12.8 12/11/2011 1157   LDLCALC 84 12/11/2011 1157     Past Medical History  Diagnosis Date  . Hypertension   . Hypothyroidism   . Hyperlipidemia   . CAD (coronary artery disease)     mild, non-obstructive  . Seasonal allergies   . Obesity     Past Surgical History  Procedure Date  . Foot surgery   . Eye surgery   . Herniated disk     Current Outpatient Prescriptions  Medication Sig Dispense Refill  . aspirin 81 MG tablet Take 81 mg by mouth daily.        . CRESTOR 10 MG tablet TAKE 1 TABLET AT BEDTIME  30 tablet  1  . etodolac (LODINE) 500 MG tablet Take 500 mg by mouth daily. As needed for back pain        . levothyroxine (SYNTHROID, LEVOTHROID) 137 MCG tablet Take 137 mcg by mouth daily.        Marland Kitchen lisinopril-hydrochlorothiazide (PRINZIDE,ZESTORETIC) 10-12.5 MG per tablet Take 1 tablet by mouth daily.        Marland Kitchen loratadine (CLARITIN) 10 MG tablet Take 10 mg by mouth daily. As needed        Allergies  Allergen  Reactions  . Amoxicillin     REACTION: rash    History   Social History  . Marital Status: Single    Spouse Name: N/A    Number of Children: N/A  . Years of Education: N/A   Occupational History  . Not on file.   Social History Main Topics  . Smoking status: Never Smoker   . Smokeless tobacco: Never Used  . Alcohol Use: Yes     Comment: rare  . Drug Use: No  . Sexually Active: Not on file   Other Topics Concern  . Not on file   Social History Narrative  . No narrative on file    Family History  Problem Relation Age of Onset  . Heart attack Father   . Heart attack Mother   . Heart attack Brother     Review of Systems:  As stated in the HPI and otherwise negative.   BP 124/81  Pulse 66  Ht 5\' 4"  (1.626 m)  Wt 210 lb (95.255 kg)  BMI 36.05 kg/m2  Physical Examination: General: Well developed, well nourished, NAD HEENT: OP clear, mucus membranes moist SKIN: warm, dry. No rashes. Neuro: No focal deficits Musculoskeletal: Muscle strength  5/5 all ext Psychiatric: Mood and affect normal Neck: No JVD, no carotid bruits, no thyromegaly, no lymphadenopathy. Lungs:Clear bilaterally, no wheezes, rhonci, crackles Cardiovascular: Regular rate and rhythm. No murmurs, gallops or rubs. Abdomen:Soft. Bowel sounds present. Non-tender.  Extremities: No lower extremity edema. Pulses are 2 + in the bilateral DP/PT.  EKG: NSR, rate 64 bpm. Incomplete RBBB.   Assessment and Plan:   1. CAD: Stable. Continue ASA and statin. No beta blocker with bradycardia. She is feeling well. Eager to start back exercising. Goal weight loss over next 6 months is 20 lbs. She is trying to eat healthy foods.

## 2012-01-18 ENCOUNTER — Other Ambulatory Visit: Payer: Self-pay | Admitting: Cardiovascular Disease

## 2012-03-26 ENCOUNTER — Other Ambulatory Visit: Payer: Self-pay | Admitting: Cardiovascular Disease

## 2012-07-10 ENCOUNTER — Ambulatory Visit: Payer: BC Managed Care – PPO | Admitting: Cardiovascular Disease

## 2012-08-13 ENCOUNTER — Telehealth: Payer: Self-pay | Admitting: Cardiovascular Disease

## 2012-08-13 NOTE — Telephone Encounter (Signed)
New Problem:    Patient called in wanting to know if she needed to have her cholesterol and other levels checked prior to her next visit.  Please call back.

## 2012-08-13 NOTE — Telephone Encounter (Signed)
Lipids and Liver profiles checked in October 2013.  I told pt she did not need  lab work at this time.

## 2012-09-10 ENCOUNTER — Ambulatory Visit: Payer: BC Managed Care – PPO | Admitting: Cardiovascular Disease

## 2012-11-11 ENCOUNTER — Telehealth: Payer: Self-pay | Admitting: Cardiovascular Disease

## 2012-11-11 DIAGNOSIS — I2510 Atherosclerotic heart disease of native coronary artery without angina pectoris: Secondary | ICD-10-CM

## 2012-11-11 NOTE — Telephone Encounter (Signed)
Returned call to patient she stated she would like to lab work before appointment with Dr.McAlhany on 11/13/12.Fasting lab for lipid and hepatic panels scheduled for 11/12/12.Message sent to Dr.McAlhany for review.

## 2012-11-11 NOTE — Telephone Encounter (Signed)
New problem    Patient wants to know should she have blood work prior to appt on Wednesday.

## 2012-11-12 ENCOUNTER — Other Ambulatory Visit (INDEPENDENT_AMBULATORY_CARE_PROVIDER_SITE_OTHER): Payer: BC Managed Care – PPO

## 2012-11-12 DIAGNOSIS — I2510 Atherosclerotic heart disease of native coronary artery without angina pectoris: Secondary | ICD-10-CM

## 2012-11-12 LAB — LIPID PANEL
Cholesterol: 127 mg/dL (ref 0–200)
HDL: 45.3 mg/dL (ref >39.00)
LDL Cholesterol: 68 mg/dL (ref 0–99)
Total CHOL/HDL Ratio: 3
Triglycerides: 68 mg/dL (ref 0.0–149.0)

## 2012-11-12 LAB — HEPATIC FUNCTION PANEL: AST: 20 U/L (ref 0–37)

## 2012-11-12 NOTE — Telephone Encounter (Signed)
Agree. Thanks. chris 

## 2012-11-13 ENCOUNTER — Ambulatory Visit (INDEPENDENT_AMBULATORY_CARE_PROVIDER_SITE_OTHER): Payer: BC Managed Care – PPO | Admitting: Cardiovascular Disease

## 2012-11-13 VITALS — BP 128/76 | HR 80 | Resp 12 | Wt 215.0 lb

## 2012-11-13 DIAGNOSIS — I2510 Atherosclerotic heart disease of native coronary artery without angina pectoris: Secondary | ICD-10-CM

## 2012-11-13 MED ORDER — ROSUVASTATIN CALCIUM 10 MG PO TABS: 10.0000 mg | ORAL_TABLET | Freq: Every day | ORAL | Status: DC

## 2012-11-13 NOTE — Patient Instructions (Addendum)
Your physician wants you to follow-up in:  12 months.  You will receive a reminder letter in the mail two months in advance. If you don't receive a letter, please call our office to schedule the follow-up appointment.   

## 2012-11-13 NOTE — Progress Notes (Signed)
History of Present Illness: 57 yo WF with history of HTN and hypothyroidism who is here today for follow up. She was seen as a new patient on 03/30/09. She had been seen by Dr. Riley Kill remotely. At the first visit, she complained of left sided and substernal chest pressure. There was associated nausea and radiation of the pain into the left arm. Because of her strong family history, risk factors and story, I arranged a diagnostic left heart cath on 03/31/09. This showed normal LV function with mild non-obstructive CAD.   She is here today for follow up. She has been doing well. Occasional jaw pain when under stress. No exertional chest pain, SOB. She teaches art for elementary school. She describes job stress, anxiety related to this. Some LE edema during day that resolves at night.   Primary Care Physician: In transition  Last Lipid Profile:Lipid Panel     Component Value Date/Time   CHOL 127 11/12/2012 0818   TRIG 68.0 11/12/2012 0818   HDL 45.30 11/12/2012 0818   CHOLHDL 3 11/12/2012 0818   VLDL 13.6 11/12/2012 0818   LDLCALC 68 11/12/2012 0818     Past Medical History  Diagnosis Date  . Hypertension   . Hypothyroidism   . Hyperlipidemia   . CAD (coronary artery disease)     mild, non-obstructive  . Seasonal allergies   . Obesity     Past Surgical History  Procedure Laterality Date  . Foot surgery    . Eye surgery    . Herniated disk      Current Outpatient Prescriptions  Medication Sig Dispense Refill  . aspirin 81 MG tablet Take 81 mg by mouth daily.        . CRESTOR 10 MG tablet TAKE 1 TABLET AT BEDTIME  30 tablet  1  . etodolac (LODINE) 500 MG tablet Take 500 mg by mouth daily. As needed for back pain        . levothyroxine (SYNTHROID, LEVOTHROID) 137 MCG tablet Take 137 mcg by mouth daily.        Marland Kitchen lisinopril-hydrochlorothiazide (PRINZIDE,ZESTORETIC) 10-12.5 MG per tablet Take 1 tablet by mouth daily.        Marland Kitchen loratadine (CLARITIN) 10 MG tablet Take 10 mg by mouth  daily. As needed      . rosuvastatin (CRESTOR) 10 MG tablet Take 1 tablet (10 mg total) by mouth daily.  30 tablet  11   No current facility-administered medications for this visit.    Allergies  Allergen Reactions  . Amoxicillin     REACTION: rash    History   Social History  . Marital Status: Single    Spouse Name: N/A    Number of Children: N/A  . Years of Education: N/A   Occupational History  . Not on file.   Social History Main Topics  . Smoking status: Never Smoker   . Smokeless tobacco: Never Used  . Alcohol Use: Yes     Comment: rare  . Drug Use: No  . Sexual Activity: Not on file   Other Topics Concern  . Not on file   Social History Narrative  . No narrative on file    Family History  Problem Relation Age of Onset  . Heart attack Father   . Heart attack Mother   . Heart attack Brother     Review of Systems:  As stated in the HPI and otherwise negative.   BP 128/76  Pulse 80  Resp 12  Physical Examination: General: Well developed, well nourished, NAD HEENT: OP clear, mucus membranes moist SKIN: warm, dry. No rashes. Neuro: No focal deficits Musculoskeletal: Muscle strength 5/5 all ext Psychiatric: Mood and affect normal Neck: No JVD, no carotid bruits, no thyromegaly, no lymphadenopathy. Lungs:Clear bilaterally, no wheezes, rhonci, crackles Cardiovascular: Regular rate and rhythm. No murmurs, gallops or rubs. Abdomen:Soft. Bowel sounds present. Non-tender.  Extremities: No lower extremity edema. Pulses are 2 + in the bilateral DP/PT.  EKG: Sinus, rate 77 bpm. PVC.   Assessment and Plan:   1. CAD: Stable. Continue ASA and statin. No beta blocker with bradycardia. She is feeling well. No changes today. Continue healthy diet and exercise.

## 2012-11-14 ENCOUNTER — Encounter: Payer: Self-pay | Admitting: Cardiovascular Disease

## 2013-01-18 ENCOUNTER — Other Ambulatory Visit: Payer: Self-pay | Admitting: Cardiovascular Disease

## 2013-02-25 ENCOUNTER — Other Ambulatory Visit: Payer: Self-pay | Admitting: Cardiovascular Disease

## 2013-08-26 ENCOUNTER — Telehealth: Payer: Self-pay | Admitting: Cardiovascular Disease

## 2013-08-26 DIAGNOSIS — E785 Hyperlipidemia, unspecified: Secondary | ICD-10-CM

## 2013-08-26 NOTE — Telephone Encounter (Signed)
New problem    Pt want to know does she need labs week before her appt. Please advise pt

## 2013-08-26 NOTE — Telephone Encounter (Signed)
Pt would like to know if she can have her cholesterol check and what ever else MD would like for her to have done before she come for a yearly check appointment on 10/07/13. Pt would like to come for labs on August 18 th if possible.

## 2013-09-01 NOTE — Telephone Encounter (Signed)
We can arrange lipids and LFTs prior to her appt. Gerald Stabs

## 2013-09-02 NOTE — Telephone Encounter (Signed)
Left message on pt's identified voicemail that fasting lab work can be done on September 30, 2013. Left message to call back if questions.

## 2013-09-30 ENCOUNTER — Other Ambulatory Visit (INDEPENDENT_AMBULATORY_CARE_PROVIDER_SITE_OTHER): Payer: BC Managed Care – PPO

## 2013-09-30 DIAGNOSIS — E785 Hyperlipidemia, unspecified: Secondary | ICD-10-CM

## 2013-09-30 LAB — HEPATIC FUNCTION PANEL
ALBUMIN: 4 g/dL (ref 3.5–5.2)
ALK PHOS: 58 U/L (ref 39–117)
ALT: 26 U/L (ref 0–35)
AST: 22 U/L (ref 0–37)
BILIRUBIN DIRECT: 0 mg/dL (ref 0.0–0.3)
BILIRUBIN TOTAL: 0.7 mg/dL (ref 0.2–1.2)
TOTAL PROTEIN: 7.4 g/dL (ref 6.0–8.3)

## 2013-09-30 LAB — LIPID PANEL
CHOL/HDL RATIO: 3
Cholesterol: 155 mg/dL (ref 0–200)
HDL: 49.8 mg/dL (ref >39.00)
LDL Cholesterol: 82 mg/dL (ref 0–99)
NONHDL: 105.2
Triglycerides: 116 mg/dL (ref 0.0–149.0)
VLDL: 23.2 mg/dL (ref 0.0–40.0)

## 2013-10-07 ENCOUNTER — Ambulatory Visit: Payer: BC Managed Care – PPO | Admitting: Cardiovascular Disease

## 2013-10-22 ENCOUNTER — Ambulatory Visit (INDEPENDENT_AMBULATORY_CARE_PROVIDER_SITE_OTHER): Payer: BC Managed Care – PPO | Admitting: Cardiovascular Disease

## 2013-10-22 ENCOUNTER — Encounter: Payer: Self-pay | Admitting: Cardiovascular Disease

## 2013-10-22 VITALS — BP 126/80 | HR 84 | Ht 64.0 in | Wt 221.0 lb

## 2013-10-22 DIAGNOSIS — I10 Essential (primary) hypertension: Secondary | ICD-10-CM

## 2013-10-22 DIAGNOSIS — I2510 Atherosclerotic heart disease of native coronary artery without angina pectoris: Secondary | ICD-10-CM

## 2013-10-22 MED ORDER — LISINOPRIL-HYDROCHLOROTHIAZIDE 20-25 MG PO TABS: 1.0000 | ORAL_TABLET | Freq: Every day | ORAL | Status: DC

## 2013-10-22 NOTE — Patient Instructions (Addendum)
Your physician wants you to follow-up in:  6 months.  You will receive a reminder letter in the mail two months in advance. If you don't receive a letter, please call our office to schedule the follow-up appointment.  Your physician recommends that you return for lab work in:  10 days.--October 31, 2013  The lab opens at 7:30 AM every week day.    Your physician has recommended you make the following change in your medication:  Increase Lisinopril/HCT to 20/25 mg by mouth daily

## 2013-10-22 NOTE — Progress Notes (Signed)
History of Present Illness: 58 yo WF with history of CAD, HTN and hypothyroidism who is here today for follow up. She was seen as a new patient on 03/30/09. She had been seen by Dr. Lia Foyer remotely. At the first visit, she complained of left sided and substernal chest pressure. There was associated nausea and radiation of the pain into the left arm. Because of her strong family history, risk factors and story, I arranged a diagnostic left heart cath on 03/31/09. This showed normal LV function with mild non-obstructive CAD.   She is here today for follow up. She has been doing well. No exertional chest pain, SOB. She teaches art for elementary school. Her BP has been elevated at home. I have reviewed her log and it is [id number] systolic. She has been under much stress at school this year.   Primary Care Physician: In transition  Last Lipid Profile:Lipid Panel     Component Value Date/Time   CHOL 155 09/30/2013 1007   TRIG 116.0 09/30/2013 1007   HDL 49.80 09/30/2013 1007   CHOLHDL 3 09/30/2013 1007   VLDL 23.2 09/30/2013 1007   LDLCALC 82 09/30/2013 1007     Past Medical History  Diagnosis Date  . Hypertension   . Hypothyroidism   . Hyperlipidemia   . CAD (coronary artery disease)     mild, non-obstructive  . Seasonal allergies   . Obesity     Past Surgical History  Procedure Laterality Date  . Foot surgery    . Eye surgery    . Herniated disk      Current Outpatient Prescriptions  Medication Sig Dispense Refill  . aspirin 81 MG tablet Take 81 mg by mouth daily.        Marland Kitchen etodolac (LODINE) 500 MG tablet Take 500 mg by mouth daily. As needed for back pain        . levothyroxine (SYNTHROID, LEVOTHROID) 137 MCG tablet Take 137 mcg by mouth daily before breakfast.      . lisinopril-hydrochlorothiazide (PRINZIDE,ZESTORETIC) 10-12.5 MG per tablet Take 1 tablet by mouth daily.        Marland Kitchen loratadine (CLARITIN) 10 MG tablet Take 10 mg by mouth daily. As needed      . rosuvastatin  (CRESTOR) 10 MG tablet Take 1 tablet (10 mg total) by mouth daily.  30 tablet  11   No current facility-administered medications for this visit.    Allergies  Allergen Reactions  . Amoxicillin     REACTION: rash    History   Social History  . Marital Status: Single    Spouse Name: N/A    Number of Children: N/A  . Years of Education: N/A   Occupational History  . Not on file.   Social History Main Topics  . Smoking status: Never Smoker   . Smokeless tobacco: Never Used  . Alcohol Use: Yes     Comment: rare  . Drug Use: No  . Sexual Activity: Not on file   Other Topics Concern  . Not on file   Social History Narrative  . No narrative on file    Family History  Problem Relation Age of Onset  . Heart attack Father   . Heart attack Mother   . Heart attack Brother     Review of Systems:  As stated in the HPI and otherwise negative.   BP 126/80  Pulse 84  Ht 5\' 4"  (1.626 m)  Wt 221 lb (100.245 kg)  BMI  37.92 kg/m2  Physical Examination: General: Well developed, well nourished, NAD HEENT: OP clear, mucus membranes moist SKIN: warm, dry. No rashes. Neuro: No focal deficits Musculoskeletal: Muscle strength 5/5 all ext Psychiatric: Mood and affect normal Neck: No JVD, no carotid bruits, no thyromegaly, no lymphadenopathy. Lungs:Clear bilaterally, no wheezes, rhonci, crackles Cardiovascular: Regular rate and rhythm. No murmurs, gallops or rubs. Abdomen:Soft. Bowel sounds present. Non-tender.  Extremities: No lower extremity edema. Pulses are 2 + in the bilateral DP/PT.  EKG: NSR, rate 84 bpm. Biatrial enlargement. Non-specific ST abnormality.   Assessment and Plan:   1. CAD: Stable. Continue ASA and statin. No beta blocker with bradycardia. She is feeling well. No changes today. Continue healthy diet and exercise.   2. HTN: BP elevated at home. I reviewed her log from home. Will increase Lisinopril/HCTZ 20/25mg  once daily. BMET in 10 days.   3. Obesity:  We spent 20 minutes today reviewing her diet and exercise plan.

## 2013-10-31 ENCOUNTER — Ambulatory Visit (INDEPENDENT_AMBULATORY_CARE_PROVIDER_SITE_OTHER): Payer: BC Managed Care – PPO | Admitting: *Deleted

## 2013-10-31 DIAGNOSIS — I10 Essential (primary) hypertension: Secondary | ICD-10-CM

## 2013-10-31 LAB — BASIC METABOLIC PANEL
BUN: 15 mg/dL (ref 6–23)
CALCIUM: 9.3 mg/dL (ref 8.4–10.5)
CO2: 26 meq/L (ref 19–32)
CREATININE: 0.65 mg/dL (ref 0.50–1.10)
Chloride: 99 mEq/L (ref 96–112)
Glucose, Bld: 99 mg/dL (ref 70–99)
Potassium: 3.7 mEq/L (ref 3.5–5.3)
Sodium: 136 mEq/L (ref 135–145)

## 2013-11-03 ENCOUNTER — Other Ambulatory Visit: Payer: Self-pay | Admitting: *Deleted

## 2013-11-03 DIAGNOSIS — I10 Essential (primary) hypertension: Secondary | ICD-10-CM

## 2013-11-03 MED ORDER — LISINOPRIL-HYDROCHLOROTHIAZIDE 20-25 MG PO TABS: 1.0000 | ORAL_TABLET | Freq: Every day | ORAL | Status: DC

## 2014-04-27 ENCOUNTER — Ambulatory Visit (INDEPENDENT_AMBULATORY_CARE_PROVIDER_SITE_OTHER): Payer: BC Managed Care – PPO | Admitting: Cardiovascular Disease

## 2014-04-27 ENCOUNTER — Encounter: Payer: Self-pay | Admitting: Cardiovascular Disease

## 2014-04-27 VITALS — BP 130/78 | HR 76 | Ht 64.0 in | Wt 228.0 lb

## 2014-04-27 DIAGNOSIS — I2510 Atherosclerotic heart disease of native coronary artery without angina pectoris: Secondary | ICD-10-CM

## 2014-04-27 DIAGNOSIS — I10 Essential (primary) hypertension: Secondary | ICD-10-CM

## 2014-04-27 DIAGNOSIS — E663 Overweight: Secondary | ICD-10-CM

## 2014-04-27 MED ORDER — LISINOPRIL 40 MG PO TABS: 40.0000 mg | ORAL_TABLET | Freq: Every day | ORAL | Status: DC

## 2014-04-27 MED ORDER — HYDROCHLOROTHIAZIDE 25 MG PO TABS: 25.0000 mg | ORAL_TABLET | Freq: Every day | ORAL | Status: DC

## 2014-04-27 NOTE — Patient Instructions (Addendum)
Your physician wants you to follow-up in: 3-4 months. You will receive a reminder letter in the mail two months in advance. If you don't receive a letter, please call our office to schedule the follow-up appointment.  Your physician has recommended you make the following change in your medication:   Stop Zestoretic.   Start Lisinopril 40 mg by mouth daily. Start Hydrochlorothiazide 25 mg by mouth daily.  Your physician recommends that you return for lab work in: 7-10 days--Scheduled for May 06, 2014.  You do not need to be fasting

## 2014-04-27 NOTE — Progress Notes (Signed)
CC: Follow up CAD   History of Present Illness: [name] with history of CAD, HTN and hypothyroidism who is here today for follow up. She was seen as a new patient on 03/30/09. She had been seen by Dr. Lia Lara remotely. At the first visit, she complained of left sided and substernal chest pressure. There was associated nausea and radiation of the pain into the left arm. Because of her strong family history, risk factors and story, I arranged a diagnostic left heart cath on 03/31/09. This showed normal LV function with mild non-obstructive CAD.   She is here today for follow up. She as been doing well. No exertional chest pain, SOB. She teaches art for elementary school. Her BP has been elevated at home. She feels stressed at work. She is not exercising.    Primary Care Physician: Gina Lara  Last Lipid Profile:Lipid Panel     Component Value Date/Time   CHOL 155 09/30/2013 1007   TRIG 116.0 09/30/2013 1007   HDL 49.80 09/30/2013 1007   CHOLHDL 3 09/30/2013 1007   VLDL 23.2 09/30/2013 1007   LDLCALC 82 09/30/2013 1007     Past Medical History  Diagnosis Date  . Hypertension   . Hypothyroidism   . Hyperlipidemia   . CAD (coronary artery disease)     mild, non-obstructive  . Seasonal allergies   . Obesity     Past Surgical History  Procedure Laterality Date  . Foot surgery    . Eye surgery    . Herniated disk      Current Outpatient Prescriptions  Medication Sig Dispense Refill  . aspirin 81 MG tablet Take 81 mg by mouth daily.      Marland Kitchen etodolac (LODINE) 500 MG tablet Take 500 mg by mouth daily. As needed for back pain      . levothyroxine (SYNTHROID, LEVOTHROID) 137 MCG tablet Take 137 mcg by mouth daily before breakfast.    . lisinopril-hydrochlorothiazide (PRINZIDE,ZESTORETIC) 20-25 MG per tablet Take 1 tablet by mouth daily. 90 tablet 3  . loratadine (CLARITIN) 10 MG tablet Take 10 mg by mouth daily. As needed    . rosuvastatin (CRESTOR) 10 MG tablet Take 1 tablet (10 mg  total) by mouth daily. 30 tablet 11   No current facility-administered medications for this visit.    Allergies  Allergen Reactions  . Amoxicillin     REACTION: rash    History   Social History  . Marital Status: Single    Spouse Name: N/A  . Number of Children: N/A  . Years of Education: N/A   Occupational History  . Not on file.   Social History Main Topics  . Smoking status: Never Smoker   . Smokeless tobacco: Never Used  . Alcohol Use: Yes     Comment: rare  . Drug Use: No  . Sexual Activity: Not on file   Other Topics Concern  . Not on file   Social History Narrative    Family History  Problem Relation Age of Onset  . Heart attack Father   . Heart attack Mother   . Heart attack Brother     Review of Systems:  As stated in the HPI and otherwise negative.   BP 130/78 mmHg  Pulse 76  Ht 5\' 4"  (1.626 m)  Wt 228 lb (103.42 kg)  BMI 39.12 kg/m2  Physical Examination: General: Well developed, well nourished, NAD HEENT: OP clear, mucus membranes moist SKIN: warm, dry. No rashes. Neuro: No focal  deficits Musculoskeletal: Muscle strength 5/5 all ext Psychiatric: Mood and affect normal Neck: No JVD, no carotid bruits, no thyromegaly, no lymphadenopathy. Lungs:Clear bilaterally, no wheezes, rhonci, crackles Cardiovascular: Regular rate and rhythm. No murmurs, gallops or rubs. Abdomen:Soft. Bowel sounds present. Non-tender.  Extremities: No lower extremity edema. Pulses are 2 + in the bilateral DP/PT.  EKG: NSR, rate 74 bpm.   Assessment and Plan:   1. CAD: Stable. Continue ASA and statin. No beta blocker with bradycardia. She is feeling well. No changes today. Continue healthy diet and exercise.   2. HTN: BP elevated at home. Will stop Zestoretic and will start Lisinopril 40 mg daily and HCTZ 25 mg daily (she had been on Zestoretic 20/25). BMET one week.   3. Obesity: We spent 20 minutes today reviewing her diet and exercise plan. I have offered to  refer her to a Nutritionist Gina Lara Chief of Staff ([employer]). She will consider and call back if she chooses to make the referral. Weight loss is very important for her and she understands this.

## 2014-05-01 ENCOUNTER — Telehealth: Payer: Self-pay | Admitting: Cardiovascular Disease

## 2014-05-01 NOTE — Telephone Encounter (Signed)
Spoke with pt. She would like referral to see Amy Hager--nutritionist at [facility].  Will make referral

## 2014-05-01 NOTE — Telephone Encounter (Signed)
New Msg       Pt calling, states she is calling about a referral from Dr. Angelena Form?    Please return pt call.

## 2014-05-04 NOTE — Telephone Encounter (Signed)
Thanks

## 2014-05-06 ENCOUNTER — Other Ambulatory Visit (INDEPENDENT_AMBULATORY_CARE_PROVIDER_SITE_OTHER): Payer: BC Managed Care – PPO | Admitting: *Deleted

## 2014-05-06 DIAGNOSIS — I2510 Atherosclerotic heart disease of native coronary artery without angina pectoris: Secondary | ICD-10-CM

## 2014-05-06 DIAGNOSIS — I10 Essential (primary) hypertension: Secondary | ICD-10-CM | POA: Diagnosis not present

## 2014-05-06 NOTE — Telephone Encounter (Signed)
I spoke with Gina Lara. She has spoken with pt and no MD referral needed for this pt.  Appt has been scheduled

## 2014-05-07 LAB — BASIC METABOLIC PANEL
BUN: 15 mg/dL (ref 6–23)
CO2: 26 mEq/L (ref 19–32)
CREATININE: 0.77 mg/dL (ref 0.40–1.20)
Calcium: 9.1 mg/dL (ref 8.4–10.5)
Chloride: 103 mEq/L (ref 96–112)
GFR: 81.76 mL/min (ref >60.00)
Glucose, Bld: 95 mg/dL (ref 70–99)
POTASSIUM: 3.8 meq/L (ref 3.5–5.1)
Sodium: 137 mEq/L (ref 135–145)

## 2014-05-08 ENCOUNTER — Telehealth: Payer: Self-pay | Admitting: Cardiovascular Disease

## 2014-05-08 NOTE — Telephone Encounter (Signed)
Notes Recorded by Burnell Blanks, MD on 05/07/2014 at 1:50 PM BMET is ok after recent medication change. Can we let her know? Thanks, chris   Notified of lab results.

## 2014-05-08 NOTE — Telephone Encounter (Signed)
lmtcb on VM

## 2014-05-08 NOTE — Telephone Encounter (Signed)
Pt rtn call to [employer] re lab work , pls call [phone]

## 2014-08-05 ENCOUNTER — Encounter: Payer: Self-pay | Admitting: *Deleted

## 2014-08-07 ENCOUNTER — Encounter: Payer: Self-pay | Admitting: Cardiovascular Disease

## 2014-08-07 ENCOUNTER — Ambulatory Visit (INDEPENDENT_AMBULATORY_CARE_PROVIDER_SITE_OTHER): Payer: BC Managed Care – PPO | Admitting: Cardiovascular Disease

## 2014-08-07 VITALS — BP 112/80 | HR 87 | Ht 64.0 in | Wt 216.0 lb

## 2014-08-07 DIAGNOSIS — I10 Essential (primary) hypertension: Secondary | ICD-10-CM

## 2014-08-07 DIAGNOSIS — E785 Hyperlipidemia, unspecified: Secondary | ICD-10-CM

## 2014-08-07 DIAGNOSIS — I2510 Atherosclerotic heart disease of native coronary artery without angina pectoris: Secondary | ICD-10-CM

## 2014-08-07 NOTE — Patient Instructions (Addendum)
Medication Instructions:   Your physician recommends that you continue on your current medications as directed. Please refer to the Current Medication list given to you today.    Labwork:  Your physician recommends that you return for fasting lab work in: August--Lipid and Liver profiles, BMP.  Scheduled for September 22, 2014.  The lab opens at 7:30 AM   Testing/Procedures: none  Follow-Up: Your physician wants you to follow-up in: 12 months.  You will receive a reminder letter in the mail two months in advance. If you don't receive a letter, please call our office to schedule the follow-up appointment.

## 2014-08-07 NOTE — Progress Notes (Signed)
Chief Complaint  Patient presents with  . Snoring      History of Present Illness: [name] with history of CAD, HTN and hypothyroidism who is here today for follow up. She was seen as a new patient on 03/30/09. She had been seen by Dr. Lia Foyer remotely. At the first visit, she complained of left sided and substernal chest pressure. There was associated nausea and radiation of the pain into the left arm. Because of her strong family history, risk factors and story, I arranged a diagnostic left heart cath on 03/31/09. This showed normal LV function with mild non-obstructive CAD. Zestoretic stopped at last visit and changed to Lisinopril and HCTZ and I increased dosage.   She is here today for follow up. She has been doing well. No exertional chest pain, SOB. She teaches art for elementary school. She has lost 12 lbs since last visit.   Primary Care Physician: K[place]ala  Last Lipid Profile:Lipid Panel     Component Value Date/Time   CHOL 155 09/30/2013 1007   TRIG 116.0 09/30/2013 1007   HDL 49.80 09/30/2013 1007   CHOLHDL 3 09/30/2013 1007   VLDL 23.2 09/30/2013 1007   LDLCALC 82 09/30/2013 1007     Past Medical History  Diagnosis Date  . Hypertension   . Hypothyroidism   . Hyperlipidemia   . CAD (coronary artery disease)     mild, non-obstructive  . Seasonal allergies   . Obesity     Past Surgical History  Procedure Laterality Date  . Foot surgery    . Eye surgery    . Herniated disk      Current Outpatient Prescriptions  Medication Sig Dispense Refill  . aspirin 81 MG tablet Take 81 mg by mouth daily.      Marland Kitchen etodolac (LODINE) 500 MG tablet Take 500 mg by mouth daily. As needed for back pain      . hydrochlorothiazide (HYDRODIURIL) 25 MG tablet Take 1 tablet (25 mg total) by mouth daily. 90 tablet 3  . levothyroxine (SYNTHROID, LEVOTHROID) 137 MCG tablet Take 137 mcg by mouth daily before breakfast.    . lisinopril (PRINIVIL,ZESTRIL) 40 MG tablet Take 1 tablet (40 mg  total) by mouth daily. 90 tablet 3  . loratadine (CLARITIN) 10 MG tablet Take 10 mg by mouth daily. As needed    . rosuvastatin (CRESTOR) 10 MG tablet Take 1 tablet (10 mg total) by mouth daily. 30 tablet 11   No current facility-administered medications for this visit.    Allergies  Allergen Reactions  . Amoxicillin     REACTION: rash    History   Social History  . Marital Status: Single    Spouse Name: N/A  . Number of Children: N/A  . Years of Education: N/A   Occupational History  . Not on file.   Social History Main Topics  . Smoking status: Never Smoker   . Smokeless tobacco: Never Used  . Alcohol Use: Yes     Comment: rare  . Drug Use: No  . Sexual Activity: Not on file   Other Topics Concern  . Not on file   Social History Narrative    Family History  Problem Relation Age of Onset  . Heart attack Father   . Heart attack Mother   . Heart attack Brother   . Hypertension Mother   . Hypertension Father   . Hypertension Brother     Review of Systems:  As stated in the HPI  and otherwise negative.   BP 112/80 mmHg  Pulse 87  Ht 5\' 4"  (1.626 m)  Wt 216 lb (97.977 kg)  BMI 37.06 kg/m2  Physical Examination: General: Well developed, well nourished, NAD HEENT: OP clear, mucus membranes moist SKIN: warm, dry. No rashes. Neuro: No focal deficits Musculoskeletal: Muscle strength 5/5 all ext Psychiatric: Mood and affect normal Neck: No JVD, no carotid bruits, no thyromegaly, no lymphadenopathy. Lungs:Clear bilaterally, no wheezes, rhonci, crackles Cardiovascular: Regular rate and rhythm. No murmurs, gallops or rubs. Abdomen:Soft. Bowel sounds present. Non-tender.  Extremities: No lower extremity edema. Pulses are 2 + in the bilateral DP/PT.  EKG:  EKG is not ordered today. The ekg ordered today demonstrates   Recent Labs: 09/30/2013: ALT 26 05/06/2014: BUN 15; Creatinine, Ser 0.77; Potassium 3.8; Sodium 137   Lipid Panel    Component Value  Date/Time   CHOL 155 09/30/2013 1007   TRIG 116.0 09/30/2013 1007   HDL 49.80 09/30/2013 1007   CHOLHDL 3 09/30/2013 1007   VLDL 23.2 09/30/2013 1007   LDLCALC 82 09/30/2013 1007     Wt Readings from Last 3 Encounters:  08/07/14 216 lb (97.977 kg)  04/27/14 228 lb (103.42 kg)  10/22/13 221 lb (100.245 kg)     Other studies Reviewed: Additional studies/ records that were reviewed today include: . Review of the above records demonstrates:    Assessment and Plan:   1. CAD: Stable. Continue ASA and statin. No beta blocker with bradycardia in past. She is feeling well. No changes today. Continue healthy diet and exercise.   2. HTN: BP controlled today.   3. Obesity: We spent most of the visit today reviewing her diet and exercise plan. She followed up with a nutritionist. She has lost 12 lbs since last visit.    4. HLD: Continue statin. Repeat lipids this August.   Current medicines are reviewed at length with the patient today.  The patient does not have concerns regarding medicines.  The following changes have been made:  no change  Labs/ tests ordered today include:   Orders Placed This Encounter  Procedures  . Basic Metabolic Panel (BMET)  . Lipid Profile  . Hepatic function panel    Disposition:   FU with me in 12  months  Signed, Lauree Chandler, MD 08/07/2014 5:24 PM    [address], [place], [place]  [address] Phone: [phone]; Fax: [phone]

## 2014-08-27 ENCOUNTER — Encounter: Payer: Self-pay | Admitting: Cardiovascular Disease

## 2014-09-22 ENCOUNTER — Other Ambulatory Visit: Payer: BC Managed Care – PPO

## 2014-09-30 ENCOUNTER — Other Ambulatory Visit (INDEPENDENT_AMBULATORY_CARE_PROVIDER_SITE_OTHER): Payer: BC Managed Care – PPO | Admitting: *Deleted

## 2014-09-30 DIAGNOSIS — I10 Essential (primary) hypertension: Secondary | ICD-10-CM | POA: Diagnosis not present

## 2014-09-30 DIAGNOSIS — E785 Hyperlipidemia, unspecified: Secondary | ICD-10-CM | POA: Diagnosis not present

## 2014-09-30 DIAGNOSIS — I2510 Atherosclerotic heart disease of native coronary artery without angina pectoris: Secondary | ICD-10-CM

## 2014-09-30 LAB — HEPATIC FUNCTION PANEL
ALBUMIN: 4.2 g/dL (ref 3.5–5.2)
ALT: 19 U/L (ref 0–35)
AST: 17 U/L (ref 0–37)
Alkaline Phosphatase: 59 U/L (ref 39–117)
Bilirubin, Direct: 0.1 mg/dL (ref 0.0–0.3)
TOTAL PROTEIN: 7 g/dL (ref 6.0–8.3)
Total Bilirubin: 0.4 mg/dL (ref 0.2–1.2)

## 2014-09-30 LAB — LIPID PANEL
Cholesterol: 129 mg/dL (ref 0–200)
HDL: 48.1 mg/dL (ref >39.00)
LDL Cholesterol: 70 mg/dL (ref 0–99)
NONHDL: 80.67
TRIGLYCERIDES: 52 mg/dL (ref 0.0–149.0)
Total CHOL/HDL Ratio: 3
VLDL: 10.4 mg/dL (ref 0.0–40.0)

## 2014-09-30 LAB — BASIC METABOLIC PANEL
BUN: 13 mg/dL (ref 6–23)
CO2: 29 mEq/L (ref 19–32)
Calcium: 9 mg/dL (ref 8.4–10.5)
Chloride: 104 mEq/L (ref 96–112)
Creatinine, Ser: 0.74 mg/dL (ref 0.40–1.20)
GFR: 85.48 mL/min (ref >60.00)
Glucose, Bld: 99 mg/dL (ref 70–99)
POTASSIUM: 3.7 meq/L (ref 3.5–5.1)
SODIUM: 140 meq/L (ref 135–145)

## 2014-10-28 ENCOUNTER — Encounter: Payer: Self-pay | Admitting: Internal Medicine

## 2015-04-14 ENCOUNTER — Other Ambulatory Visit: Payer: Self-pay | Admitting: Cardiovascular Disease

## 2015-09-17 ENCOUNTER — Telehealth: Payer: Self-pay | Admitting: Cardiovascular Disease

## 2015-09-17 DIAGNOSIS — I10 Essential (primary) hypertension: Secondary | ICD-10-CM

## 2015-09-17 DIAGNOSIS — E785 Hyperlipidemia, unspecified: Secondary | ICD-10-CM

## 2015-09-17 NOTE — Telephone Encounter (Signed)
New message      Pt has an appt on 10-29-15 and would like to have labs drawn prior.  There is no order in the computer.  Please call and let her know if she can have labs drawn prior and what day.

## 2015-09-17 NOTE — Telephone Encounter (Signed)
Spoke with pt and CMET and Lipid profiles scheduled for September 11,2017

## 2015-10-15 ENCOUNTER — Other Ambulatory Visit: Payer: Self-pay | Admitting: Cardiovascular Disease

## 2015-10-25 ENCOUNTER — Other Ambulatory Visit: Payer: BC Managed Care – PPO

## 2015-10-29 ENCOUNTER — Ambulatory Visit: Payer: BC Managed Care – PPO | Admitting: Cardiovascular Disease

## 2015-11-12 ENCOUNTER — Encounter: Payer: Self-pay | Admitting: Cardiovascular Disease

## 2015-11-16 ENCOUNTER — Telehealth: Payer: Self-pay | Admitting: Cardiovascular Disease

## 2015-11-16 NOTE — Telephone Encounter (Signed)
Left message to call back  

## 2015-11-16 NOTE — Telephone Encounter (Signed)
New Message  Pt voiced wondering if we've received the fax from [facility] from lab work from her pcp appt and also wondering what labs or if any labs are needed for 12/13/2015 lab appt.  Please f/u with pt.

## 2015-11-18 MED ORDER — HYDROCHLOROTHIAZIDE 25 MG PO TABS: ORAL_TABLET | ORAL | 1 refills | Status: DC

## 2015-11-18 MED ORDER — LISINOPRIL 40 MG PO TABS: ORAL_TABLET | ORAL | 1 refills | Status: DC

## 2015-11-18 MED ORDER — ROSUVASTATIN CALCIUM 10 MG PO TABS: 10.0000 mg | ORAL_TABLET | Freq: Every day | ORAL | 1 refills | Status: DC

## 2015-11-18 NOTE — Telephone Encounter (Signed)
Follow Up:; ° ° °Returning your call. °

## 2015-11-18 NOTE — Addendum Note (Signed)
Addended by: Thompson Grayer on: 11/18/2015 04:13 PM   Modules accepted: Orders

## 2015-11-18 NOTE — Telephone Encounter (Signed)
I spoke with pt and told her we have received lab work from primary care.  I told her she would not need to have lipid and liver checked here on October 30,2017 and I would cancel this appt. She is asking to have refills of Crestor, lisinopril and HCTZ sent to [facility] on [address]. Will send in.

## 2015-11-20 ENCOUNTER — Encounter: Payer: Self-pay | Admitting: Obstetrics & Gynecology

## 2015-12-13 ENCOUNTER — Other Ambulatory Visit: Payer: BC Managed Care – PPO

## 2016-01-19 ENCOUNTER — Ambulatory Visit: Payer: BC Managed Care – PPO | Admitting: Cardiovascular Disease

## 2016-04-17 ENCOUNTER — Encounter: Payer: Self-pay | Admitting: Cardiovascular Disease

## 2016-04-17 ENCOUNTER — Ambulatory Visit (INDEPENDENT_AMBULATORY_CARE_PROVIDER_SITE_OTHER): Payer: BC Managed Care – PPO | Admitting: Cardiovascular Disease

## 2016-04-17 VITALS — BP 110/60 | HR 67 | Ht 63.75 in | Wt 210.8 lb

## 2016-04-17 DIAGNOSIS — I10 Essential (primary) hypertension: Secondary | ICD-10-CM

## 2016-04-17 DIAGNOSIS — E7800 Pure hypercholesterolemia, unspecified: Secondary | ICD-10-CM

## 2016-04-17 DIAGNOSIS — I2510 Atherosclerotic heart disease of native coronary artery without angina pectoris: Secondary | ICD-10-CM

## 2016-04-17 NOTE — Progress Notes (Signed)
Chief Complaint  Patient presents with  . essential hypertension,benign     History of Present Illness: 61 yo WF with history of CAD, HTN and hypothyroidism who is here today for follow up. She was seen as a new patient on 03/30/09. At the first visit, she complained of left sided and substernal chest pressure. There was associated nausea and radiation of the pain into the left arm. Because of her strong family history, risk factors and story, I arranged a diagnostic left heart cath on 03/31/09. This showed normal LV function with mild non-obstructive CAD.   She is here today for follow up. She has been doing well. No exertional chest pain, SOB. She teaches art for elementary school. She continues to lose weight. She has been exercising.   Primary Care Physician: Lujean Amel, MD  Past Medical History:  Diagnosis Date  . CAD (coronary artery disease)    mild, non-obstructive  . Hyperlipidemia   . Hypertension   . Hypothyroidism   . Obesity   . Seasonal allergies     Past Surgical History:  Procedure Laterality Date  . EYE SURGERY    . FOOT SURGERY    . herniated disk      Current Outpatient Prescriptions  Medication Sig Dispense Refill  . aspirin 81 MG tablet Take 81 mg by mouth daily.      Marland Kitchen etodolac (LODINE) 500 MG tablet Take 500 mg by mouth daily. As needed for back pain      . hydrochlorothiazide (HYDRODIURIL) 25 MG tablet TAKE 1 TABLET (25 MG TOTAL) BY MOUTH DAILY. 90 tablet 1  . levothyroxine (SYNTHROID, LEVOTHROID) 137 MCG tablet Take 137 mcg by mouth daily before breakfast.    . lisinopril (PRINIVIL,ZESTRIL) 40 MG tablet TAKE 1 TABLET (40 MG TOTAL) BY MOUTH DAILY. 90 tablet 1  . loratadine (CLARITIN) 10 MG tablet Take 10 mg by mouth daily. As needed for allergies    . rosuvastatin (CRESTOR) 10 MG tablet Take 1 tablet (10 mg total) by mouth daily. 90 tablet 1   No current facility-administered medications for this visit.     Allergies  Allergen Reactions  .  Amoxicillin Swelling and Hives    REACTION: rash  . Erythromycin Nausea Only and Other (See Comments)    Stomach upset GI UPSET AND STOMACH BURNING    Social History   Social History  . Marital status: Single    Spouse name: N/A  . Number of children: N/A  . Years of education: N/A   Occupational History  . Not on file.   Social History Main Topics  . Smoking status: Never Smoker  . Smokeless tobacco: Never Used  . Alcohol use Yes     Comment: rare  . Drug use: No  . Sexual activity: Not on file   Other Topics Concern  . Not on file   Social History Narrative  . No narrative on file    Family History  Problem Relation Age of Onset  . Heart attack Father   . Heart attack Mother   . Heart attack Brother   . Hypertension Mother   . Hypertension Father   . Hypertension Brother     Review of Systems:  As stated in the HPI and otherwise negative.   BP 110/60   Pulse 67   Ht 5' 3.75" (1.619 m)   Wt 210 lb 12.8 oz (95.6 kg)   BMI 36.47 kg/m   Physical Examination: General: Well developed, well nourished, NAD  HEENT: OP clear, mucus membranes moist  SKIN: warm, dry. No rashes. Neuro: No focal deficits  Musculoskeletal: Muscle strength 5/5 all ext  Psychiatric: Mood and affect normal  Neck: No JVD, no carotid bruits, no thyromegaly, no lymphadenopathy.  Lungs:Clear bilaterally, no wheezes, rhonci, crackles Cardiovascular: Regular rate and rhythm. No murmurs, gallops or rubs. Abdomen:Soft. Bowel sounds present. Non-tender.  Extremities: No lower extremity edema. Pulses are 2 + in the bilateral DP/PT.  EKG:  EKG is  ordered today. The ekg ordered today demonstrates NSR, rate 67 bpm. Incomplete RBBB  Recent Labs: No results found for requested labs within last 8760 hours.   Lipid Panel    Component Value Date/Time   CHOL 129 09/30/2014 1044   TRIG 52.0 09/30/2014 1044   HDL 48.10 09/30/2014 1044   CHOLHDL 3 09/30/2014 1044   VLDL 10.4 09/30/2014 1044     LDLCALC 70 09/30/2014 1044     Wt Readings from Last 3 Encounters:  04/17/16 210 lb 12.8 oz (95.6 kg)  08/07/14 216 lb (98 kg)  04/27/14 228 lb (103.4 kg)     Other studies Reviewed: Additional studies/ records that were reviewed today include: . Review of the above records demonstrates:    Assessment and Plan:   1. CAD without angina: No chest pain suggestive of angina. Will continue ASA and statin. No beta blocker with bradycardia in past. She is feeling well. No changes today. Continue healthy diet and exercise.   2. HTN: BP controlled today. No changes.   3. HLD: LDL 57 in Sept 2017 (scanned document from primary care). Continue statin.   Current medicines are reviewed at length with the patient today.  The patient does not have concerns regarding medicines.  The following changes have been made:  no change  Labs/ tests ordered today include:   Orders Placed This Encounter  Procedures  . EKG 12-Lead    Disposition:   FU with me in 12  months  Signed, Lauree Chandler, MD 04/17/2016 4:59 PM    [facility] [place], [place], [place]  [address] Phone: [phone]; Fax: [phone]

## 2016-04-17 NOTE — Patient Instructions (Signed)

## 2016-08-10 ENCOUNTER — Other Ambulatory Visit: Payer: Self-pay | Admitting: Cardiovascular Disease

## 2016-11-15 ENCOUNTER — Encounter: Payer: Self-pay | Admitting: Obstetrics & Gynecology

## 2017-01-12 ENCOUNTER — Telehealth: Payer: Self-pay | Admitting: Cardiovascular Disease

## 2017-01-12 NOTE — Telephone Encounter (Signed)
Patient calling and states that she has had many changes to her BP medicines over the past 1.5 months. Patient was taking Lisinopril 40 mg QD and HCTZ 25 QD. Patient states that she developed lip swelling and tingling and her PCP Dr. Dorthy Cooler d/c'd her Lisinopril and started her on irbesartan 150 mg QD. She states that it got better but then the lip swelling and tingling returned so her PCP switched her to olmesartan 20 mg QD. Patient states that she continued to have these symptoms so her PCP then switched her to amlodipine 2.5 mg QD. Patient states that she only took her HCTZ 25 mg QD for 2 days until she received her amlodipine. She states that she started this yesterday. Patient states that her BP has been running 150-170s/90s with her most recent BP being 160/98 at 11:00. She states that she did take the amlodipine 2.5 mg and HCTZ 25 mg this morning at 6:00 AM. She states that her BP was well controlled previously on the Lisinopril and ran around 110/70. Patient is also complaining of intermittent chest discomfort that is located in the middle of her chest that is described as dull and feels like it is squeezing. Patient states that she has been dizzy and has had a HA. Patient denies any other symptoms. Patient states that the squeezing in her chest right now is not that bad. Patient does not have Rx for NTG. Patient's last cath in 2011 showed normal LV function with mild non-obstructive CAD. Patient does have a strong family Hx. Will discuss patient with DOD and see if she agrees with recommendations for patient to take an extra amlodipine and to see if she has further recommendations. Discussed with DOD who agreed for patient to take an extra 2.5 mg of amlodipine now and for her to call back before the end of the day to report her BP and symptoms. Made patient aware of recommendations and agrees with plan. Will route to Dr. Angelena Form as well.

## 2017-01-12 NOTE — Telephone Encounter (Signed)
New message    Patient calling with BP concerns and chest "squeezing". Went 2 days without blood pressure medication.  Please call.   Pt c/o BP issue: STAT if pt c/o blurred vision, one-sided weakness or slurred speech  1. What are your last 5 BP readings? 157/93, 147/96, 110/70  2. Are you having any other symptoms (ex. Dizziness, headache, blurred vision, passed out)? dizziness  3. What is your BP issue? Concerned BP too high    Pt c/o of Chest Pain: STAT if CP now or developed within 24 hours  1. Are you having CP right now? yes  2. Are you experiencing any other symptoms (ex. SOB, nausea, vomiting, sweating)? NO  3. How long have you been experiencing CP? 2 days  4. Is your CP continuous or coming and going? Coming and going  5. Have you taken Nitroglycerin? NO ?

## 2017-01-12 NOTE — Telephone Encounter (Signed)
F/u Message  Pt call to report bp taken at 4:40pm today 130/74. Please call back to discuss if needed,

## 2017-01-12 NOTE — Telephone Encounter (Signed)
Returned call to patient. She states that her BP is better with the extra 2.5 mg of amlodipine. BP 130/74. Patient states that her chest discomfort is also better. She states that she can still feel a little twinge in her chest but she states that it is not bothersome. Patient instructed to increase her amlodipine to 5 mg QD per DOD. Patient advised to continue monitoring her BP and symptoms and let us know if they worsen.Patient verbalized understanding and thanked me for the call.

## 2017-01-15 ENCOUNTER — Telehealth: Payer: Self-pay | Admitting: *Deleted

## 2017-01-15 ENCOUNTER — Encounter (INDEPENDENT_AMBULATORY_CARE_PROVIDER_SITE_OTHER): Payer: Self-pay

## 2017-01-15 ENCOUNTER — Telehealth: Payer: Self-pay | Admitting: Cardiovascular Disease

## 2017-01-15 ENCOUNTER — Encounter: Payer: Self-pay | Admitting: Physician Assistant

## 2017-01-15 ENCOUNTER — Ambulatory Visit: Payer: BC Managed Care – PPO | Admitting: Physician Assistant

## 2017-01-15 VITALS — BP 142/70 | HR 80 | Ht 63.75 in | Wt 223.0 lb

## 2017-01-15 DIAGNOSIS — R21 Rash and other nonspecific skin eruption: Secondary | ICD-10-CM | POA: Diagnosis not present

## 2017-01-15 DIAGNOSIS — I10 Essential (primary) hypertension: Secondary | ICD-10-CM

## 2017-01-15 DIAGNOSIS — E785 Hyperlipidemia, unspecified: Secondary | ICD-10-CM

## 2017-01-15 DIAGNOSIS — R0683 Snoring: Secondary | ICD-10-CM

## 2017-01-15 DIAGNOSIS — I2510 Atherosclerotic heart disease of native coronary artery without angina pectoris: Secondary | ICD-10-CM | POA: Diagnosis not present

## 2017-01-15 DIAGNOSIS — R42 Dizziness and giddiness: Secondary | ICD-10-CM

## 2017-01-15 MED ORDER — AMLODIPINE BESYLATE 10 MG PO TABS
10.0000 mg | ORAL_TABLET | Freq: Every day | ORAL | 3 refills | Status: DC
Start: 2017-01-15 — End: 2017-02-09

## 2017-01-15 NOTE — Telephone Encounter (Signed)
Thanks!    Chris.

## 2017-01-15 NOTE — Telephone Encounter (Signed)
Sleep study referral.

## 2017-01-15 NOTE — Telephone Encounter (Signed)
Pt c/o BP issue: STAT if pt c/o blurred vision, one-sided weakness or slurred speech  1. What are your last 5 BP readings? 160/98 148/94  2. Are you having any other symptoms (ex. Dizziness, headache, blurred vision, passed out)? Headache dizziness   3. What is your BP issue? high

## 2017-01-15 NOTE — Telephone Encounter (Signed)
-----   Message from Jeanann Lewandowsky, [place] sent at 01/15/2017  2:35 PM EST ----- PT NEEDS SLEEP STUDY

## 2017-01-15 NOTE — Patient Instructions (Addendum)
Medication Instructions:  Your physician has recommended you make the following change in your medication: 1.  INCREASE the Amlodipine to 10 mg daily   Labwork: None ordered  Testing/Procedures: Your physician has recommended that you have a sleep study. This test records several body functions during sleep, including: brain activity, eye movement, oxygen and carbon dioxide blood levels, heart rate and rhythm, breathing rate and rhythm, the flow of air through your mouth and nose, snoring, body muscle movements, and chest and belly movement.  You have been referred to DERMATOLOGY   Follow-Up: Your physician recommends that you schedule a follow-up appointment in: [place], [place]   Any Other Special Instructions Will Be Listed Below (If Applicable). Sleep Studies A sleep study (polysomnogram) is a series of tests done while you are sleeping. It can show how well you sleep. This can help your health care provider diagnose a sleep disorder and show how severe your sleep disorder is. A sleep study may lead to treatment that will help you sleep better and prevent other medical problems caused by poor sleep. If you have a sleep disorder, you may also be at risk for:  Sleep-related accidents.  High blood pressure.  Heart disease.  Stroke.  Other medical conditions.  Sleep disorders are common. Your health care provider may suspect a sleep disorder if you:  Have loud snoring most nights.  Have brief periods when you stop breathing at night.  Feel sleepy on most days.  Fall asleep suddenly during the day.  Have trouble falling asleep or staying asleep.  Feel like you need to move your legs when trying to fall asleep.  Have dreams that seem very real shortly after falling asleep.  Feel like you cannot move when you first wake up.  Which tests will I need to have? Most sleep studies last all night and include these tests:  Recordings of your brain  activity.  Recordings of your eye movements.  Recording of your heart rate and rhythm.  Blood pressure readings.  Readings of the amount of oxygen in your blood.  Measurements of your chest and belly movement as you breathe during sleep.  If you have signs of the sleep disorder called sleep apnea during your test, you may get a mask to wear for the second half of the night.  The mask provides continuous positive airway pressure (CPAP). This may improve sleep apnea significantly.  You will then have all tests done again with the mask in place to see if your measurements and recordings change.  How are sleep studies done? Most sleep studies are done over one full night of sleep.  You will arrive at the study center in the evening and can go home in the morning.  Bring your pajamas and toothbrush.  Do not have caffeine on the day of your sleep study.  Your health care provider will let you know if you need to stop taking any of your regular medicines before the test.  To do the tests included in a polysomnogram, you will have:  Round, sticky patches with sensors attached to recording wires (electrodes) placed on your scalp, face, chest, and limbs.  Wires from all the electrodes and sensors run from your bed to a computer. The wires can be taken off and put back on if you need to get out of bed to go to the bathroom.  A sensor placed over your nose to measure airflow.  A finger clip put on one  finger to measure your blood oxygen level.  A belt around your belly and a belt around your chest to measure breathing movements.  Where are sleep studies done? Sleep studies are done at sleep centers. A sleep center may be inside a hospital, office, or clinic. The room where you have the study may look like a hospital room or a hotel room. The health care providers doing the study may come in and out of the room during the study. Most of the time, they will be in another room monitoring  your test. How is information from sleep studies helpful? A polysomnogram can be used along with your medical history and a physical exam to diagnose conditions, such as:  Sleep apnea.  Restless legs syndrome.  Sleep-related seizure disorders.  Sleep-related movement disorders.  A medical doctor who specializes in sleep will evaluate your sleep study. The specialist will share the results with your primary health care provider. Treatments based on your sleep study may include:  Improving your sleep habits (sleep hygiene).  Wearing a CPAP mask.  Wearing an oral device at night to improve breathing and reduce snoring.  Taking medicine for: ? Restless legs syndrome. ? Sleep-related seizure disorder. ? Sleep-related movement disorder.  This information is not intended to replace advice given to you by your health care provider. Make sure you discuss any questions you have with your health care provider. Document Released: 08/06/2002 Document Revised: 09/26/2015 Document Reviewed: 04/07/2013 Elsevier Interactive Patient Education  [employer].     If you need a refill on your cardiac medications before your next appointment, please call your pharmacy.

## 2017-01-15 NOTE — Progress Notes (Signed)
Cardiology Office Note    Date:  01/15/2017  ID:  Gina Lara, DOB [birth year], MRN [id number] PCP:  Lujean Amel, MD  Cardiologist:  Dr. Angelena Form   Chief Complaint: blood pressure problems  History of Present Illness:  Gina Lara is a 61 y.o. female with history of mild nonobstructive CAD, HTN, hypothyroidism, obesity, hyperlipidemia, pre-diabetes, seasonal allergies who presents for evaluation of high blood pressure. She remotely established with Dr. Angelena Form for chest pressure into her left arm and nausea. Because of strong family history, definitive heart cath was arranged showing mild nonbstructive disease with 20% serial LAD stenosis, 30% diffuse apical LAD, 30% mid AV groove Cx, 40% mRCA (small vessel), LVEF 65%. Last labs showed normal TSH and fT4, glucose 91, BUN 18, Cr 1.078, Na 140, K 4.5, CO2 22, TProtein 7.3, albumin 4.6, ALP 76, AST 25, ALT 21.  She historically has had well-controlled blood pressure on a regimen of HCTZ 25mg  daily and lisinopril 40mg  daily. However, within the last few months had spontaneous angioedema thus lisinopril was discontinued. She was tried on irbesartan with similar reaction, then olmesartan with similar reaction (by primary care). An epi-pen was prescribed. She was started on low dose amlodipine which was titrated to 5mg  daily. She continues to notice frequent BP elevation from 782-956 systolic, accompanied by sensation of dizziness, headache, chest fullness. She does not have any chest pain when her blood pressure is normal, nor does she have any discomfort with exertion. 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 She has no angina or dyspnea with this. No palpitations, LEE, orthopnea, syncope. She has tolerated the amlodipine without difficulty. She does admit to snoring and occasional daytime fatigue - has been told she "saws logs" loudly while sleeping. No prior sleep  study.  Unrelated to today's visit, she expresses concern over raspiness of her voice and a facial race on her right cheek which has been present for approximately a year. She has not had comprehensive workup for these things.  Past Medical History:  Diagnosis Date  . CAD (coronary artery disease)    a. Cath 2011: mild nonbstructive disease with 20% serial LAD stenosis, 30% diffuse apical LAD, 30% mid AV groove Cx, 40% mRCA (small vessel), LVEF 65%.   . Hyperlipidemia   . Hypertension   . Hypothyroidism   . Obesity   . Pre-diabetes   . Seasonal allergies     Past Surgical History:  Procedure Laterality Date  . EYE SURGERY    . FOOT SURGERY    . herniated disk      Current Medications: Current Meds  Medication Sig  . amLODipine (NORVASC) 2.5 MG tablet Take 5 mg by mouth daily.  Marland Kitchen aspirin 81 MG tablet Take 81 mg by mouth daily.    Marland Kitchen etodolac (LODINE) 500 MG tablet Take 500 mg by mouth daily. As needed for back pain    . hydrochlorothiazide (HYDRODIURIL) 25 MG tablet TAKE 1 TABLET BY MOUTH EVERY DAY  . levothyroxine (SYNTHROID, LEVOTHROID) 137 MCG tablet Take 137 mcg by mouth daily before breakfast.  . loratadine (CLARITIN) 10 MG tablet Take 10 mg by mouth daily. As needed for allergies  . rosuvastatin (CRESTOR) 10 MG tablet Take 1 tablet (10 mg total) by mouth daily.     Allergies:   Amoxicillin; Ace inhibitors; Angiotensin receptor blockers; and Erythromycin   Social History   Socioeconomic History  .  Marital status: Single    Spouse name: None  . Number of children: None  . Years of education: None  . Highest education level: None  Social Needs  . Financial resource strain: None  . Food insecurity - worry: None  . Food insecurity - inability: None  . Transportation needs - medical: None  . Transportation needs - non-medical: None  Occupational History  . None  Tobacco Use  . Smoking status: Never Smoker  . Smokeless tobacco: Never Used  Substance and Sexual  Activity  . Alcohol use: Yes    Comment: rare  . Drug use: No  . Sexual activity: None  Other Topics Concern  . None  Social History Narrative  . None     Family History:  Family History  Problem Relation Age of Onset  . Heart attack Father   . Heart attack Mother   . Heart attack Brother   . Hypertension Mother   . Hypertension Father   . Hypertension Brother     ROS:   Please see the history of present illness.  All other systems are reviewed and otherwise negative.    PHYSICAL EXAM:   VS:  BP (!) 142/70   Pulse 80   Ht 5' 3.75" (1.619 m)   Wt 223 lb (101.2 kg)   SpO2 96%   BMI 38.58 kg/m   BMI: Body mass index is 38.58 kg/m. GEN: Well nourished, well developed obese WF, in no acute distress  HEENT: normocephalic, atraumatic Neck: no JVD, carotid bruits, or masses Cardiac: RRR; no murmurs, rubs, or gallops, no edema  Respiratory:  clear to auscultation bilaterally, normal work of breathing GI: soft, nontender, nondistended, + BS MS: no deformity or atrophy  Skin: warm and dry, no rash, right erythematous dry facial rash Neuro:  Alert and Oriented x 3, Strength and sensation are intact, follows commands Psych: euthymic mood, full affect  Wt Readings from Last 3 Encounters:  01/15/17 223 lb (101.2 kg)  04/17/16 210 lb 12.8 oz (95.6 kg)  08/07/14 216 lb (98 kg)      Studies/Labs Reviewed:   EKG:  EKG was ordered today and personally reviewed by me and demonstrates NSR 67bpm, incomplete RBBB, no acute ST-T Changes, similar to prior.  Recent Labs: No results found for requested labs within last 8760 hours.   Lipid Panel    Component Value Date/Time   CHOL 129 09/30/2014 1044   TRIG 52.0 09/30/2014 1044   HDL 48.10 09/30/2014 1044   CHOLHDL 3 09/30/2014 1044   VLDL 10.4 09/30/2014 1044   LDLCALC 70 09/30/2014 1044    Additional studies/ records that were reviewed today include: Summarized above.    ASSESSMENT & PLAN:   1. Essential HTN - I am  not surprised her blood pressure remains high since she previously required 40mg  of lisinopril in addition to her HCTZ for control, and is now only on amlodipine 5mg . Will increase amlodipine to 10mg  daily. If blood pressure remains >130/70 on this regimen, would recommend initiation of carvedilol 3.125mg  BID and titrate if needed. I asked her to check BP daily over the next few days and call if it still remains elevated at which time the following medication can be sent in (would recommend short-term rx so we can increase dose if needed). We also discussed her snoring. She is aware of need to lose weight, which would also help BP. Will arrange sleep study to exclude OSA. Also discussed avoidance of NSAIDs and decreasing dietary  sodium. 2. Dizziness/chest discomfort - occurs exclusively in setting of higher BP. Follow with stricter BP control.  3. Mild CAD - she describes discomfort that occurs exclusively with higher BP. She has not had any exertional symptoms or higher risk features. Will follow symptoms with improved BP control. EKG stable. Warning symptoms reviewed. If symptoms recur in the setting of normal blood pressures, would update stress test. 4. Hyperlipidemia - continue statin. 5. Facial rash - appear similar to rosacea but asymmetric. States she's reported it to [facility] and PCP before but has not had formal diagnosis or workup. Given symptoms lasting approximately a year, I will refer to dermatology. I also advised she talk to her PCP about the raspiness of her voice as this may indicate need for ENT evaluation. We did discuss possibility of silent reflux contributing and I suggested sometimes patients are advised to start OTC Zantac or PPI, but asked her to discuss with primary care.  Disposition: F/u with me in 4 weeks.   Medication Adjustments/Labs and Tests Ordered: Current medicines are reviewed at length with the patient today.  Concerns regarding medicines are outlined above.  Medication changes, Labs and Tests ordered today are summarized above and listed in the Patient Instructions accessible in Encounters.   Signed, Charlie Pitter, PA-C  01/15/2017 2:02 PM    [facility] [address], [place], [place]  [address] Phone: [phone]; Fax: [phone]

## 2017-01-15 NOTE — Telephone Encounter (Signed)
See previous phone note from 11/30 I spoke with pt. She reports after increasing amlodipine to 5 mg daily BP was upper 130's-140's/80's over the weekend. Last night BP was 154/94.  Today she is at work and last 2 BP readings were 148/94 and 141/92. This was about 30 minutes ago. Took amlodipine around 6 AM.  Feels very stressed about BP.  Also complaining of headache and dizziness.  Several recent BP medication changes have been made by primary care.   I scheduled pt to see Melina Copa, PA today at 1:30

## 2017-01-30 NOTE — Telephone Encounter (Signed)
Informed patient of upcoming sleep study and patient understanding was verbalized. Patient understands her sleep study is scheduled for Thursday March 01 2017. Patient understands her sleep study will be done at [facility] sleep lab. Patient understands she will receive a sleep packet in a week or so. Patient understands to call if she does not receive the sleep packet in a timely manner. Patient agrees with treatment and thanked me for call.

## 2017-02-09 ENCOUNTER — Telehealth: Payer: Self-pay | Admitting: Physician Assistant

## 2017-02-09 MED ORDER — AMLODIPINE BESYLATE 5 MG PO TABS: 5.0000 mg | ORAL_TABLET | Freq: Every day | ORAL | 3 refills | Status: DC

## 2017-02-09 MED ORDER — CARVEDILOL 6.25 MG PO TABS: 6.2500 mg | ORAL_TABLET | Freq: Two times a day (BID) | ORAL | 3 refills | Status: DC

## 2017-02-09 NOTE — Progress Notes (Addendum)
Cardiology Office Note    Date:  02/12/2017  ID:  MA MUNOZ, DOB 01/04/1956, MRN [id number] PCP:  Lujean Amel, MD  Cardiologist:  Dr. Angelena Form   Chief Complaint: f/u BP  History of Present Illness:  Gina Lara is a 61 y.o. female with history of mild nonobstructive CAD, HTN, hypothyroidism, obesity, hyperlipidemia, pre-diabetes, seasonal allergies who presents for evaluation of high blood pressure. She remotely established with Dr. Angelena Form for chest pressure into her left arm and nausea. Because of strong family history, definitive heart cath was arranged showing mild nonbstructive disease with 20% serial LAD stenosis, 30% diffuse apical LAD, 30% mid AV groove Cx, 40% mRCA (small vessel), LVEF 65%.   She historically has had well-controlled blood pressure on a regimen of HCTZ 25mg  daily and lisinopril 40mg  daily. However, within the last few months had spontaneous angioedema thus lisinopril was discontinued. She was tried on irbesartan with similar reaction, then olmesartan with similar reaction (by primary care). An epi-pen was prescribed. She was started on low dose amlodipine which was titrated to 5mg  daily but continued to have high blood pressure so this was titrated to 10mg  daily. She had not had any angina recently but sleep study was ordered due to daytime fatigue (pending). Unrelated to that visit, she also expressed concern over raspiness of her voice and a facial race on her right cheek which has been present for approximately a year. Referral to derm was placed and she was asked to discuss hoarseness with primary MD. Last labs 11/2016 showed normal TSH and fT4, LDL 72, glucose 91, BUN 18, Cr 1.078, Na 140, K 4.5, CO2 22, TProtein 7.3, albumin 4.6, ALP 76, AST 25, ALT 21.  She called in 12/28 reporting increased bilateral lower extremity edema that developed shortly after starting higher dose of amlodipine. She was asked to drop amlodipine back to 5mg  daily and start  carvedilol 6.25mg  BID. Edema is better but still present. Her BPs at home have still been running 010-272 systolic. Initial SBP by tech was 124 but recheck by me was 140/80. She denies any CP, SOB, lower extremity pain or redness. She saw her allergist for unrelated allergies and was started on Xyzal, Singulair and a nasal spray. No recent steroids. Facial rash improved.   Past Medical History:  Diagnosis Date  . CAD (coronary artery disease)    a. Cath 2011: mild nonbstructive disease with 20% serial LAD stenosis, 30% diffuse apical LAD, 30% mid AV groove Cx, 40% mRCA (small vessel), LVEF 65%.   . Hyperlipidemia   . Hypertension   . Hypothyroidism   . Obesity   . Pre-diabetes   . Seasonal allergies     Past Surgical History:  Procedure Laterality Date  . EYE SURGERY    . FOOT SURGERY    . herniated disk      Current Medications: Current Meds  Medication Sig  . amLODipine (NORVASC) 5 MG tablet Take 1 tablet (5 mg total) by mouth daily.  Marland Kitchen aspirin 81 MG tablet Take 81 mg by mouth daily.    . carvedilol (COREG) 6.25 MG tablet Take 1 tablet (6.25 mg total) by mouth 2 (two) times daily.  Marland Kitchen DYMISTA 137-50 MCG/ACT SUSP Place 1 spray into both nostrils daily.  Marland Kitchen etodolac (LODINE) 500 MG tablet Take 500 mg by mouth daily. As needed for back pain    . hydrochlorothiazide (HYDRODIURIL) 25 MG tablet TAKE 1 TABLET BY MOUTH EVERY DAY  . levocetirizine (XYZAL) 5 MG tablet  Take 5 mg by mouth every evening.  Marland Kitchen levothyroxine (SYNTHROID, LEVOTHROID) 137 MCG tablet Take 137 mcg by mouth daily before breakfast.  . montelukast (SINGULAIR) 10 MG tablet Take 10 mg by mouth daily.  Marland Kitchen PROAIR HFA 108 (90 Base) MCG/ACT inhaler Inhale 1-2 puffs into the lungs every 4 (four) hours as needed. Wheezing or shortness of breath  . rosuvastatin (CRESTOR) 10 MG tablet Take 1 tablet (10 mg total) by mouth daily.     Allergies:   Amoxicillin; Ace inhibitors; Angiotensin receptor blockers; and Erythromycin    Social History   Socioeconomic History  . Marital status: Single    Spouse name: None  . Number of children: None  . Years of education: None  . Highest education level: None  Social Needs  . Financial resource strain: None  . Food insecurity - worry: None  . Food insecurity - inability: None  . Transportation needs - medical: None  . Transportation needs - non-medical: None  Occupational History  . None  Tobacco Use  . Smoking status: Never Smoker  . Smokeless tobacco: Never Used  Substance and Sexual Activity  . Alcohol use: Yes    Comment: rare  . Drug use: No  . Sexual activity: None  Other Topics Concern  . None  Social History Narrative  . None     Family History:  Family History  Problem Relation Age of Onset  . Heart attack Father   . Heart attack Mother   . Heart attack Brother   . Hypertension Mother   . Hypertension Father   . Hypertension Brother     ROS:   Please see the history of present illness.  All other systems are reviewed and otherwise negative.    PHYSICAL EXAM:   VS:  BP 140/82   Pulse 75   Ht 5\' 4"  (1.626 m)   Wt 226 lb 12.8 oz (102.9 kg)   SpO2 95%   BMI 38.93 kg/m   BMI: Body mass index is 38.93 kg/m. GEN: Well nourished, well developed obese WF, in no acute distress  HEENT: normocephalic, atraumatic Neck: no JVD, carotid bruits, or masses Cardiac: RRR; no murmurs, rubs, or gallops, trace puffy soft nonpitting BLE edema, no palpable cords  Respiratory:  clear to auscultation bilaterally, normal work of breathing GI: soft, nontender, nondistended, + BS MS: no deformity or atrophy  Skin: warm and dry, no rash Neuro:  Alert and Oriented x 3, Strength and sensation are intact, follows commands Psych: euthymic mood, full affect  Wt Readings from Last 3 Encounters:  02/12/17 226 lb 12.8 oz (102.9 kg)  01/15/17 223 lb (101.2 kg)  04/17/16 210 lb 12.8 oz (95.6 kg)      Studies/Labs Reviewed:   EKG:   EKG was not ordered  today.  Recent Labs: No results found for requested labs within last 8760 hours.   Lipid Panel    Component Value Date/Time   CHOL 129 09/30/2014 1044   TRIG 52.0 09/30/2014 1044   HDL 48.10 09/30/2014 1044   CHOLHDL 3 09/30/2014 1044   VLDL 10.4 09/30/2014 1044   LDLCALC 70 09/30/2014 1044    Additional studies/ records that were reviewed today include: Summarized above    ASSESSMENT & PLAN:   1. Essential HTN, complicated by lower extremity edema - more recently has had worsening edema after starting higher doses of amlodipine. This is mainly dependent. She reports significant improvement with going back to 5mg  dose but it still persists, worse  at the end of the day. She is worried about what this will look like after standing on her feet all day when school resumes. No palpable cords, erythema or other concerning signs of VTE. BP remains borderline controlled. She reports feeling her best at BPs of 110/70s when previously on lisinopril so she is particularly concerned about continuing to have SBPs in 140s-150s. Will d/c HCTZ and start chlorthalidone 25mg  daily. Increase carvedilol to 12.5mg  BID. I asked her to follow her BPs daily and notify us of readings within 3 days. I also told her if SBPs do not begin to downtrend in next 48 hours to call us before then. Discussed 2g sodium restriction and not drinking more than 2L of fluid of day given the point of diuretic use. Check baseline BMET/Mg/BNP and repeat BMET/Mg in 1 week. I told her if edema does not begin to improve over the next 24-48 hours, worsens, or any new symptoms to contact us. She verbalized understanding. 2. Nonobstructive CAD - no recent symptoms to suggest angina. Continue ASA, BB, statin. 3. Hypothyroidism - recent TSH wnl. 4. Snoring - sleep study scheduled for next month.  Disposition: F/u with me in 4 weeks.   Medication Adjustments/Labs and Tests Ordered: Current medicines are reviewed at length with the  patient today.  Concerns regarding medicines are outlined above. Medication changes, Labs and Tests ordered today are summarized above and listed in the Patient Instructions accessible in Encounters.   Signed, Charlie Pitter, PA-C  02/12/2017 10:27 AM    [facility] [place], [place], [place]  [address] Phone: [phone]; Fax: [phone]

## 2017-02-09 NOTE — Telephone Encounter (Signed)
Returned call to patient to instruct her per Melina Copa, PA decrease amlodipine to 5 mg once a day. Patient states her BP has been running in the 140s/80s. Since her BP is greater than 120 instructed her to start carvedilol 6.25 mg two times a day.   Informed her if edema persists/worsen or any new CP/SOB then she should seek care over the weekend.   Patient verbalized understanding and thanked me for the call.

## 2017-02-09 NOTE — Addendum Note (Signed)
Addended by: Teressa Senter on: 02/09/2017 05:28 PM   Modules accepted: Orders

## 2017-02-09 NOTE — Telephone Encounter (Signed)
Returned call to patient. She states that within the past 1 week she has noticed an increase in bilateral lower extremity swelling and constipation. She states that she is having a hard time with wearing her shoes due to the swelling. Patient states amlodipine was increased to 10 mg once a day. She started taking it in then evening and has noticed a slight improvement in her swelling. Patient denies any changes to fluid or sodium intake. She denies any other symptoms. Informed patient I would send to Melina Copa, PA to review and if she had further recommendation we would give her a call back. Patient has an upcoming appt with Melina Copa, PA on 02/12/17. Patient in agreement with plan and thanked me for the call.

## 2017-02-09 NOTE — Telephone Encounter (Signed)
New Message   Pt c/o medication issue:  1. Name of Medication: amlodipine  2. How are you currently taking this medication (dosage and times per day)? 10 mg 1/day  3. Are you having a reaction (difficulty breathing--STAT)? Leg swelling and constipation  4. What is your medication issue? Leg swelling and constipation    Pt c/o swelling: STAT is pt has developed SOB within 24 hours  1) How much weight have you gained and in what time span? 5lbs  2) If swelling, where is the swelling located? legs  3) Are you currently taking a fluid pill? yes  4) Are you currently SOB? No  5) Do you have a log of your daily weights (if so, list)? No  6) Have you gained 3 pounds in a day or 5 pounds in a week? yes  7) Have you traveled recently? [place]

## 2017-02-09 NOTE — Telephone Encounter (Addendum)
Please call patient. OK to return to amlodipine 5mg  daily. Let's instead try a strategy of adding carvedilol.  If BP has been running <120 on higher dose of amlodipine, please add carvedilol 3.125mg  BID. If BP has still been running >120 on higher dose of amlodipine, please add carvedilol 6.25mg  BID.  Continue to follow BP and call us if BP remains >130 on top overall. Keep f/u as planned. If edema persists/worsens or any new CP/SOB, needs to seek care over the weekend. Dayna Dunn PA-C

## 2017-02-12 ENCOUNTER — Encounter: Payer: Self-pay | Admitting: Physician Assistant

## 2017-02-12 ENCOUNTER — Ambulatory Visit: Payer: BC Managed Care – PPO | Admitting: Physician Assistant

## 2017-02-12 VITALS — BP 140/82 | HR 75 | Ht 64.0 in | Wt 226.8 lb

## 2017-02-12 DIAGNOSIS — E039 Hypothyroidism, unspecified: Secondary | ICD-10-CM | POA: Diagnosis not present

## 2017-02-12 DIAGNOSIS — I10 Essential (primary) hypertension: Secondary | ICD-10-CM

## 2017-02-12 DIAGNOSIS — I2510 Atherosclerotic heart disease of native coronary artery without angina pectoris: Secondary | ICD-10-CM | POA: Diagnosis not present

## 2017-02-12 DIAGNOSIS — R600 Localized edema: Secondary | ICD-10-CM

## 2017-02-12 DIAGNOSIS — R0683 Snoring: Secondary | ICD-10-CM | POA: Diagnosis not present

## 2017-02-12 LAB — BASIC METABOLIC PANEL
BUN/Creatinine Ratio: 15 (ref 12–28)
BUN: 11 mg/dL (ref 8–27)
CALCIUM: 9.3 mg/dL (ref 8.7–10.3)
CHLORIDE: 103 mmol/L (ref 96–106)
CO2: 25 mmol/L (ref 20–29)
Creatinine, Ser: 0.73 mg/dL (ref 0.57–1.00)
GFR, EST AFRICAN AMERICAN: 103 mL/min/{1.73_m2} (ref >59)
GFR, EST NON AFRICAN AMERICAN: 89 mL/min/{1.73_m2} (ref >59)
Glucose: 99 mg/dL (ref 65–99)
POTASSIUM: 4.4 mmol/L (ref 3.5–5.2)
SODIUM: 142 mmol/L (ref 134–144)

## 2017-02-12 LAB — PRO B NATRIURETIC PEPTIDE: NT-PRO BNP: 67 pg/mL (ref 0–287)

## 2017-02-12 LAB — MAGNESIUM: Magnesium: 2.1 mg/dL (ref 1.6–2.3)

## 2017-02-12 MED ORDER — CHLORTHALIDONE 25 MG PO TABS: 25.0000 mg | ORAL_TABLET | Freq: Every day | ORAL | 3 refills | Status: DC

## 2017-02-12 MED ORDER — CARVEDILOL 6.25 MG PO TABS: 12.5000 mg | ORAL_TABLET | Freq: Two times a day (BID) | ORAL | 3 refills | Status: DC

## 2017-02-12 NOTE — Patient Instructions (Addendum)
Medication Instructions:  1.  STOP the Amlodipine 2.  STOP the Hydrochlorothiazide 3.  INCREASE the Carvedilol 6.25 taking 2 tablets by mouth twice a day 4.  START Chlorthalidone 25 mg taking 1 tablet daily  Labwork: TODAY:  BMET, MAGNESIUM, & PRO BNP 1 WEEK: BMET & MAGNESIUM   Testing/Procedures:   Follow-Up: Your physician recommends that you schedule a follow-up appointment in: [place], PA-C  Any Other Special Instructions Will Be Listed Below (If Applicable).     If you need a refill on your cardiac medications before your next appointment, please call your pharmacy.

## 2017-02-13 DIAGNOSIS — G4733 Obstructive sleep apnea (adult) (pediatric): Secondary | ICD-10-CM

## 2017-02-13 HISTORY — DX: Obstructive sleep apnea (adult) (pediatric): G47.33

## 2017-02-19 ENCOUNTER — Other Ambulatory Visit: Payer: BC Managed Care – PPO

## 2017-02-19 DIAGNOSIS — I10 Essential (primary) hypertension: Secondary | ICD-10-CM

## 2017-02-19 DIAGNOSIS — E039 Hypothyroidism, unspecified: Secondary | ICD-10-CM

## 2017-02-19 DIAGNOSIS — R0683 Snoring: Secondary | ICD-10-CM

## 2017-02-19 DIAGNOSIS — I2510 Atherosclerotic heart disease of native coronary artery without angina pectoris: Secondary | ICD-10-CM

## 2017-02-19 DIAGNOSIS — R600 Localized edema: Secondary | ICD-10-CM

## 2017-02-20 LAB — BASIC METABOLIC PANEL
BUN/Creatinine Ratio: 19 (ref 12–28)
BUN: 14 mg/dL (ref 8–27)
CALCIUM: 9.4 mg/dL (ref 8.7–10.3)
CO2: 28 mmol/L (ref 20–29)
CREATININE: 0.74 mg/dL (ref 0.57–1.00)
Chloride: 98 mmol/L (ref 96–106)
GFR calc Af Amer: 101 mL/min/{1.73_m2} (ref >59)
GFR, EST NON AFRICAN AMERICAN: 88 mL/min/{1.73_m2} (ref >59)
GLUCOSE: 109 mg/dL — AB (ref 65–99)
Potassium: 3.5 mmol/L (ref 3.5–5.2)
Sodium: 142 mmol/L (ref 134–144)

## 2017-02-20 LAB — MAGNESIUM: MAGNESIUM: 2.2 mg/dL (ref 1.6–2.3)

## 2017-02-21 ENCOUNTER — Telehealth: Payer: Self-pay | Admitting: *Deleted

## 2017-02-21 MED ORDER — POTASSIUM CHLORIDE CRYS ER 20 MEQ PO TBCR: 20.0000 meq | EXTENDED_RELEASE_TABLET | Freq: Every day | ORAL | 3 refills | Status: DC

## 2017-02-21 NOTE — Telephone Encounter (Signed)
-----   Message from Charlie Pitter, [place] sent at 02/20/2017  7:41 AM EST ----- Please let patient know labs were normal except blood sugar minimally elevated (has h/o pre-diabetes in chart). Potassium level is lower limit of normal so add KCl 72meq daily. Please increase dietary intake of healthy sources of potassium including bananas, squash, yogurt, white beans, sweet potatoes, leafy greens, and avocados.  Dayna Dunn PA-C

## 2017-03-01 ENCOUNTER — Ambulatory Visit (HOSPITAL_BASED_OUTPATIENT_CLINIC_OR_DEPARTMENT_OTHER): Payer: BC Managed Care – PPO | Attending: Physician Assistant | Admitting: Cardiology

## 2017-03-01 VITALS — Ht 64.0 in | Wt 220.0 lb

## 2017-03-01 DIAGNOSIS — Z6837 Body mass index (BMI) 37.0-37.9, adult: Secondary | ICD-10-CM | POA: Insufficient documentation

## 2017-03-01 DIAGNOSIS — R5383 Other fatigue: Secondary | ICD-10-CM | POA: Insufficient documentation

## 2017-03-01 DIAGNOSIS — E669 Obesity, unspecified: Secondary | ICD-10-CM | POA: Diagnosis not present

## 2017-03-01 DIAGNOSIS — G4733 Obstructive sleep apnea (adult) (pediatric): Secondary | ICD-10-CM | POA: Diagnosis not present

## 2017-03-01 DIAGNOSIS — Z79899 Other long term (current) drug therapy: Secondary | ICD-10-CM | POA: Insufficient documentation

## 2017-03-01 DIAGNOSIS — R0683 Snoring: Secondary | ICD-10-CM | POA: Diagnosis not present

## 2017-03-03 NOTE — Progress Notes (Signed)
Please let patient know test showed severe sleep apnea along with dropping her oxygen saturations. Dr. Radford Pax recommends a trial of CPAP (see report). Please set her up for CPAP and sleep appt with Dr. Radford Pax. Thank you. Jarrett Albor PA-C

## 2017-03-03 NOTE — Procedures (Signed)
 NAME: Gina Lara DATE OF BIRTH:  04/02/1955 MEDICAL RECORD:  [id number]  LOCATION: [facility]  PHYSICIAN: Traci Turner  DATE OF STUDY: 03/01/2017  SLEEP STUDY TYPE: Positive Airway Pressure Titration  CLINICAL INFORMATION Sleep Study Type: Split Night CPAP  Indication for sleep study: Fatigue, Obesity, OSA, Snoring, Witnessed Apnea  Epworth Sleepiness Score: 11  SLEEP STUDY TECHNIQUE As per the AASM Manual for the Scoring of Sleep and Associated Events v2.3 (April 2016) with a hypopnea requiring 4% desaturations.  The channels recorded and monitored were frontal, central and occipital EEG, electrooculogram (EOG), submentalis EMG (chin), nasal and oral airflow, thoracic and abdominal wall motion, anterior tibialis EMG, snore microphone, electrocardiogram, and pulse oximetry. Continuous positive airway pressure (CPAP) was initiated when the patient met split night criteria and was titrated according to treat sleep-disordered breathing.  MEDICATIONS Medications self-administered by patient taken the night of the study : KLOR-CON, CARVEDILOL, DYMISTA SPRAY, CRESTOR  RESPIRATORY PARAMETERS Diagnostic Total AHI (/hr):44.5 RDI (/hr):44.8  OA Index (/hr): 16.3  CA Index (/hr):0.3 REM AHI (/hr): 95.6  NREM AHI (/hr):36.7  Supine AHI (/hr):54.7  Non-supine AHI (/hr):15.00 Min O2 Sat (%):71.00  Mean O2 (%):91.26  Time below 88% (min):20.9   Titration Optimal Pressure (cm):15  AHI at Optimal Pressure (/hr):0.0  Min O2 at Optimal Pressure (%):91.0 Supine % at Optimal (%):100  Sleep % at Optimal (%):97   SLEEP ARCHITECTURE The recording time for the entire night was 376.1 minutes.  During a baseline period of 228.3 minutes, the patient slept for 202.2 minutes in REM and nonREM, yielding a sleep efficiency of 88.6%. Sleep onset after lights out was 3.1 minutes with a REM latency of 151.5 minutes. The patient spent 4.45% of the night in stage N1  sleep, 63.56% in stage N2 sleep, 18.63% in stage N3 and 13.36% in REM.  During the titration period of 144.6 minutes, the patient slept for 113.0 minutes in REM and nonREM, yielding a sleep efficiency of 78.2%. Sleep onset after CPAP initiation was 9.1 minutes with a REM latency of 62.0 minutes. The patient spent 4.42% of the night in stage N1 sleep, 68.14% in stage N2 sleep, 11.06% in stage N3 and 16.37% in REM.  CARDIAC DATA The 2 lead EKG demonstrated sinus rhythm. The mean heart rate was 63.72 beats per minute. Other EKG findings include: None.  LEG MOVEMENT DATA The total Periodic Limb Movements of Sleep (PLMS) were 0. The PLMS index was 0.00 .  IMPRESSIONS - Severe obstructive sleep apnea occurred during the diagnostic portion of the study (AHI = 44.5/hour). An optimal PAP pressure was selected for this patient ( 15 cm of water) - No significant central sleep apnea occurred during the diagnostic portion of the study (CAI = 0.3/hour). - Severe oxygen desaturation was noted during the diagnostic portion of the study (Min O2 =71.00%). - No snoring was audible during the diagnostic portion of the study. - No cardiac abnormalities were noted during this study. - Clinically significant periodic limb movements did not occur during sleep.  DIAGNOSIS - Obstructive Sleep Apnea (327.23 [G47.33 ICD-10])  RECOMMENDATIONS - Trial of CPAP therapy on 15 cm H2O with a Small size Resmed Full Face Mask AirFit F20 mask and heated humidification. - Avoid alcohol, sedatives and other CNS depressants that may worsen sleep apnea and disrupt normal sleep architecture. - Sleep hygiene should be reviewed to assess factors that may improve sleep quality. - Weight management and regular exercise should be initiated or continued. -   Return to Sleep Center for re-evaluation after 10 weeks of therapy               Traci Turner Diplomate, American Board of Sleep Medicine  ELECTRONICALLY SIGNED ON:  03/03/2017,  11:18 AM [facility] PH: [phone]   FX: [phone] ACCREDITED BY THE AMERICAN ACADEMY OF SLEEP MEDICINE 

## 2017-03-04 ENCOUNTER — Telehealth: Payer: Self-pay | Admitting: Physician Assistant

## 2017-03-04 NOTE — Telephone Encounter (Signed)
Left quicknote on the sleep study report but not clear where this gets documented. Please let patient know sleep study showed significant sleep apnea and oxygen falling during rest, suspect this has been affecting her BP. Gae Bon, please coordinate CPAP orders with Dr. Radford Pax and arrange sleep f/u with her. Thanks. Gina Lauro PA-C

## 2017-03-06 ENCOUNTER — Ambulatory Visit: Payer: BC Managed Care – PPO | Admitting: Physician Assistant

## 2017-03-19 ENCOUNTER — Telehealth: Payer: Self-pay | Admitting: *Deleted

## 2017-03-19 ENCOUNTER — Encounter: Payer: Self-pay | Admitting: *Deleted

## 2017-03-19 NOTE — Telephone Encounter (Signed)
-----   Message from Charlie Pitter, [place] sent at 03/19/2017  7:18 AM EST ----- Regarding: Sleep apnea Hi Gae Bon, I had sent you and Anderson Malta a telephone note and result note on a sleep study for this patient that was abnormal, requesting to notify the patient of sleep study findings and arrange sleep follow-up as outlined by Dr. Radford Pax in her sleep study. See phone note from 1/20. I don't see any further comments on this telephone note or any other sleep follow-up arranged. Can you please make sure this gets done? I also just sent another message on a patient named Ciro Backer ([id number]) who needs essentially the same thing. Please let me know if I should be handling these differently - it is very important that we get these folks plugged into CPAP as advised. Thanks. Dayna

## 2017-03-19 NOTE — Telephone Encounter (Signed)
Informed patient of titration results and verbalized understanding was indicated. Patient understands she has significant sleep apnea and her Titration was successful. Patient understands Dr Radford Pax has ordered her a CPAP. Patient understands she will be contacted by [facility] to set up her cpap. She understands to call if [facility] does not contact her with new setup in a timely manner. She understands she will be called once confirmation has been received from [facility] that she has received her new machine to schedule 10 week follow up appointment.  [facility] notified of new cpap order  Please add to airview She was grateful for the call and thanked me.

## 2017-03-19 NOTE — Telephone Encounter (Signed)
This encounter was created in error - please disregard.

## 2017-03-19 NOTE — Telephone Encounter (Signed)
-----   Message from Gina Margarita, MD sent at 03/03/2017 11:22 AM EST ----- Please let patient know that they have significant sleep apnea and had successful PAP titration and will be set up with PAP unit.  Please let DME know that order is in EPIC.  Please set patient up for OV in 10 weeks

## 2017-03-22 ENCOUNTER — Encounter: Payer: Self-pay | Admitting: Physician Assistant

## 2017-03-22 NOTE — Progress Notes (Signed)
Cardiology Office Note    Date:  03/23/2017  ID:  [name], DOB 04-05-1955, MRN [id number] PCP:  Gina Amel, MD  Cardiologist:  Dr. Angelena Form   Chief Complaint: f/u HTN  History of Present Illness:  Gina Lara is a 62 y.o. female with history of mild nonobstructive CAD, HTN, hypothyroidism, obesity, hyperlipidemia, pre-diabetes, seasonal allergies who presents for follow-up of HTN. She remotely established with Dr. Angelena Form for chest pressure into her left arm and nausea. Because of strong family history, definitive heart cath was arranged showing mild nonbstructive disease with 20% serial LAD stenosis, 30% diffuse apical LAD, 30% mid AV groove Cx, 40% mRCA (small vessel), LVEF 65%.   She historically has had well-controlled blood pressure on a regimen of HCTZ 25mg  daily and lisinopril 40mg  daily. However, in 2018 she had spontaneous angioedema thus lisinopril was discontinued. Primary care tried her on irbesartan as well as olmesartan with similar reactions. An epi-pen was prescribed. She was started on low dose amlodipine which was titrated to 5mg  daily but continued to have high blood pressure so this was titrated to 10mg  daily. She had some edema with the amlodipine so this was decreased to 5mg  daily again and she was asked to start carvedilol. At [place] 12/31 due to continued high BP, HCTZ was changed to chlorthalidone and carvedilol was increased to 12.5mg  BID. Sleep study was ordered and revealed OSA, and plans for CPAP are in process. Most recent BMET 1/7 showed K 3.5, Cr 0.74, Mg 2.2. Otherwise last labs 11/2016 showed normal TSH and fT4, LDL 72, glucose 91, CO2 22, TProtein 7.3, albumin 4.6, ALP 76, AST 25, ALT 21.  She returns for follow-up today feeling well. She is "bummed" that her study showed sleep apnea but is trying to look on the bright side after talking to friends and family that she might feel more refreshed with CPAP. She was not impressed with the facility this was  performed at or the copay. She reports BP readings have been running 120-140s (mostly on the higher end), and still occasionally 150s.   Past Medical History:  Diagnosis Date  . CAD (coronary artery disease)    a. Cath 2011: mild nonbstructive disease with 20% serial LAD stenosis, 30% diffuse apical LAD, 30% mid AV groove Cx, 40% mRCA (small vessel), LVEF 65%.   . Hyperlipidemia   . Hypertension   . Hypothyroidism   . Obesity   . OSA (obstructive sleep apnea) 02/2017  . Pre-diabetes   . Seasonal allergies     Past Surgical History:  Procedure Laterality Date  . EYE SURGERY    . FOOT SURGERY    . herniated disk      Current Medications: Current Meds  Medication Sig  . aspirin 81 MG tablet Take 81 mg by mouth daily.    . carvedilol (COREG) 6.25 MG tablet Take 2 tablets (12.5 mg total) by mouth 2 (two) times daily.  . chlorthalidone (HYGROTON) 25 MG tablet Take 1 tablet (25 mg total) by mouth daily.  . [facility] ANTI-FUNGAL 2 % powder Take 1 application by mouth daily.  . [facility] SKIN TREATMENT 12 % lotion Apply 1 application topically daily.  Marland Kitchen doxycycline (VIBRAMYCIN) 100 MG capsule Take 100 mg by mouth daily.  Marland Kitchen DYMISTA 137-50 MCG/ACT SUSP Place 1 spray into both nostrils daily.  . fexofenadine (ALLEGRA) 180 MG tablet Take 180 mg by mouth daily.  Marland Kitchen levothyroxine (SYNTHROID, LEVOTHROID) 137 MCG tablet Take 137 mcg by mouth daily before  breakfast.  . montelukast (SINGULAIR) 10 MG tablet Take 10 mg by mouth daily.  . potassium chloride SA (K-DUR,KLOR-CON) 20 MEQ tablet Take 1 tablet (20 mEq total) by mouth daily.  Marland Kitchen PROAIR HFA 108 (90 Base) MCG/ACT inhaler Inhale 1-2 puffs into the lungs every 4 (four) hours as needed. Wheezing or shortness of breath  . rosuvastatin (CRESTOR) 10 MG tablet Take 1 tablet (10 mg total) by mouth daily.  . [DISCONTINUED] carvedilol (COREG) 6.25 MG tablet Take 2 tablets (12.5 mg total) by mouth 2 (two) times daily.  . [DISCONTINUED] etodolac (LODINE) 500 MG  tablet Take 500 mg by mouth daily. As needed for back pain    . [DISCONTINUED] levocetirizine (XYZAL) 5 MG tablet Take 5 mg by mouth every evening.    Allergies:   Amoxicillin; Ace inhibitors; Amlodipine; Angiotensin receptor blockers; and Erythromycin   Social History   Socioeconomic History  . Marital status: Single    Spouse name: None  . Number of children: None  . Years of education: None  . Highest education level: None  Social Needs  . Financial resource strain: None  . Food insecurity - worry: None  . Food insecurity - inability: None  . Transportation needs - medical: None  . Transportation needs - non-medical: None  Occupational History  . None  Tobacco Use  . Smoking status: Never Smoker  . Smokeless tobacco: Never Used  Substance and Sexual Activity  . Alcohol use: Yes    Comment: rare  . Drug use: No  . Sexual activity: None  Other Topics Concern  . None  Social History Narrative  . None     Family History:  Family History  Problem Relation Age of Onset  . Heart attack Father   . Heart attack Mother   . Heart attack Brother   . Hypertension Mother   . Hypertension Father   . Hypertension Brother    ROS:   Please see the history of present illness.  All other systems are reviewed and otherwise negative.    PHYSICAL EXAM:   VS:  BP 132/82   Pulse 67   Ht 5\' 4"  (1.626 m)   Wt 221 lb 12.8 oz (100.6 kg)   BMI 38.07 kg/m   BMI: Body mass index is 38.07 kg/m. GEN: Well nourished, well developed obese WF in no acute distress  HEENT: normocephalic, atraumatic Neck: no JVD, carotid bruits, or masses Cardiac: RRR; no murmurs, rubs, or gallops, no edema  Respiratory:  clear to auscultation bilaterally, normal work of breathing GI: soft, nontender, nondistended, + BS MS: no deformity or atrophy  Skin: warm and dry, no rash Neuro:  Alert and Oriented x 3, Strength and sensation are intact, follows commands Psych: euthymic mood, full affect  Wt  Readings from Last 3 Encounters:  03/23/17 221 lb 12.8 oz (100.6 kg)  03/01/17 220 lb (99.8 kg)  02/12/17 226 lb 12.8 oz (102.9 kg)      Studies/Labs Reviewed:   EKG:   EKG was not ordered today.  Recent Labs: 02/12/2017: NT-Pro BNP 67 02/19/2017: BUN 14; Creatinine, Ser 0.74; Magnesium 2.2; Potassium 3.5; Sodium 142   Lipid Panel    Component Value Date/Time   CHOL 129 09/30/2014 1044   TRIG 52.0 09/30/2014 1044   HDL 48.10 09/30/2014 1044   CHOLHDL 3 09/30/2014 1044   VLDL 10.4 09/30/2014 1044   LDLCALC 70 09/30/2014 1044    Additional studies/ records that were reviewed today include: Summarized above.  ASSESSMENT & PLAN:   1. Essential HTN - increase carvedilol to 25mg  BID and follow. I anticipate her BP will improve with treatment of OSA and also some weight loss. Long discussion about this today. She will continue to monitor at home. 2. Sleep apnea - she reports that our team has her set up for education as well as the initial titration. She will need to follow with Dr. Radford Pax for this. She requested copy of her sleep study. Discussed long term risks of untreated OSA. She is disheartened that she has it but motivated to give it a try, and also newly motivated to try and lose weight. 3. Mild CAD - asymptomatic. Continue observation for symptoms. 4. Lower extremity edema - improved on chlorthalidone. Continue.  Disposition: F/u with Dr. Angelena Form in 3-4 months.   Medication Adjustments/Labs and Tests Ordered: Current medicines are reviewed at length with the patient today.  Concerns regarding medicines are outlined above. Medication changes, Labs and Tests ordered today are summarized above and listed in the Patient Instructions accessible in Encounters.   Signed, Charlie Pitter, PA-C  03/23/2017 3:46 PM    [facility] [place], [place], [place]  [address] Phone: [phone]; Fax: [phone]

## 2017-03-23 ENCOUNTER — Ambulatory Visit: Payer: BC Managed Care – PPO | Admitting: Physician Assistant

## 2017-03-23 ENCOUNTER — Encounter: Payer: Self-pay | Admitting: Physician Assistant

## 2017-03-23 VITALS — BP 132/82 | HR 67 | Ht 64.0 in | Wt 221.8 lb

## 2017-03-23 DIAGNOSIS — G4733 Obstructive sleep apnea (adult) (pediatric): Secondary | ICD-10-CM | POA: Diagnosis not present

## 2017-03-23 DIAGNOSIS — I2510 Atherosclerotic heart disease of native coronary artery without angina pectoris: Secondary | ICD-10-CM

## 2017-03-23 DIAGNOSIS — I10 Essential (primary) hypertension: Secondary | ICD-10-CM

## 2017-03-23 DIAGNOSIS — R600 Localized edema: Secondary | ICD-10-CM | POA: Diagnosis not present

## 2017-03-23 MED ORDER — CARVEDILOL 25 MG PO TABS: 25.0000 mg | ORAL_TABLET | Freq: Two times a day (BID) | ORAL | 3 refills | Status: DC

## 2017-03-23 MED ORDER — CARVEDILOL 6.25 MG PO TABS: 12.5000 mg | ORAL_TABLET | Freq: Two times a day (BID) | ORAL | 3 refills | Status: DC

## 2017-03-23 NOTE — Patient Instructions (Addendum)
Medication Instructions:  Your physician has recommended you make the following change in your medication:  1.  INCREASE Carvedilol to 25 mg taking 1 tablet twice day    Labwork: None ordered  Testing/Procedures: None ordered  Follow-Up: Your physician recommends that you schedule a follow-up appointment in: 3-4 MONTHS WITH DR. Angelena Form   Any Other Special Instructions Will Be Listed Below (If Applicable).   If you need a refill on your cardiac medications before your next appointment, please call your pharmacy.

## 2017-03-29 NOTE — Telephone Encounter (Signed)
Patient has a 10 week follow up appointment scheduled for Jul 03 2017 at 4:20. Patient understands she needs to keep this appointment for insurance compliance. Patient was grateful for the call and thanked me.

## 2017-04-06 NOTE — Telephone Encounter (Signed)
Informed patient of titration results and verbalized understanding was indicated. Patient understands she has significant sleep apnea and her Titration was successful. Patient understands Dr Radford Pax has ordered her a CPAP. Patient understands she will be contacted by [facility] to set up her cpap. She understands to call if [facility] does not contact her with new setup in a timely manner. She understands she will be called once confirmation has been received from [facility] that she has received her new machine to schedule 10 week follow up appointment.  [facility] notified of new cpap order  Please add to airview She was grateful for the call and thanked me.

## 2017-04-06 NOTE — Telephone Encounter (Signed)
    Patient has a 10 week follow up appointment scheduled for Jul 03 2017 at 4:20. Patient understands she needs to keep this appointment for insurance compliance. Patient was grateful for the call and thanked me.

## 2017-04-10 NOTE — Telephone Encounter (Signed)
RE: Pressue change  Turner, Eber Hong, MD  Freada Bergeron, CMA        Please set on Auto CPAP from 4 to [place]       ----- Message -----  From: Freada Bergeron, CMA  Sent: 04/10/2017 10:36 AM  To: Sueanne Margarita, MD  Subject: Pressue change                  Jennifer at [facility] called to ask for a pressure change for this patient. Patient complains of too much air blowing out the top and side of her mask and extremely dry mouth in the morning with her gums stuck to her teeth. Pressure is set on 15. Anderson Malta would like an order to reduce pressure and set to auto.  Josefa Half

## 2017-04-19 ENCOUNTER — Other Ambulatory Visit: Payer: Self-pay | Admitting: Cardiovascular Disease

## 2017-04-19 ENCOUNTER — Encounter: Payer: Self-pay | Admitting: Obstetrics & Gynecology

## 2017-04-19 MED ORDER — ROSUVASTATIN CALCIUM 10 MG PO TABS: 10.0000 mg | ORAL_TABLET | Freq: Every day | ORAL | 3 refills | Status: DC

## 2017-05-02 ENCOUNTER — Ambulatory Visit
Admission: RE | Admit: 2017-05-02 | Discharge: 2017-05-02 | Disposition: A | Discharge status: Home / Self Care | Payer: BC Managed Care – PPO | Source: Ambulatory Visit | Attending: Family Medicine | Admitting: Family Medicine

## 2017-05-02 ENCOUNTER — Other Ambulatory Visit: Payer: Self-pay | Admitting: Family Medicine

## 2017-05-02 DIAGNOSIS — R062 Wheezing: Secondary | ICD-10-CM

## 2017-06-08 ENCOUNTER — Encounter: Payer: Self-pay | Admitting: Cardiovascular Disease

## 2017-06-20 ENCOUNTER — Telehealth: Payer: Self-pay | Admitting: Cardiovascular Disease

## 2017-06-20 NOTE — Telephone Encounter (Signed)
Pt want to know if she will need lab work before her 06-28-17 appt?..... Will forward message to Dr. Angelena Form and his nurse Fraser Din to review and advise. Did not note anything ordered/recommended in past notes or labs.

## 2017-06-20 NOTE — Telephone Encounter (Signed)
New Message:    Pt want to know if she will need lab work before her 06-28-17 appt?

## 2017-06-21 NOTE — Telephone Encounter (Signed)
I think her lipids are followed in primary care. Can you see that we have checked her lipids before? Dayna checked BMET in December. I don't think she needs labs before I see her. chris

## 2017-06-21 NOTE — Telephone Encounter (Signed)
I left message on pt's cell phone voicemail that she did not need lab work prior to visit. Left message to call office if questions. Last lipids were checked by primary care in October 2018.

## 2017-06-28 ENCOUNTER — Ambulatory Visit: Payer: BC Managed Care – PPO | Admitting: Cardiovascular Disease

## 2017-07-03 ENCOUNTER — Ambulatory Visit: Payer: BC Managed Care – PPO | Admitting: Cardiology

## 2017-07-13 ENCOUNTER — Encounter: Payer: Self-pay | Admitting: Obstetrics & Gynecology

## 2017-08-05 ENCOUNTER — Encounter: Payer: Self-pay | Admitting: Gastroenterology

## 2017-08-22 ENCOUNTER — Encounter: Payer: Self-pay | Admitting: Gastroenterology

## 2017-08-28 ENCOUNTER — Ambulatory Visit: Payer: BC Managed Care – PPO | Admitting: Cardiology

## 2017-08-28 ENCOUNTER — Encounter: Payer: Self-pay | Admitting: Cardiology

## 2017-08-28 ENCOUNTER — Encounter

## 2017-08-28 VITALS — BP 130/72 | HR 75 | Ht 64.0 in | Wt 227.8 lb

## 2017-08-28 DIAGNOSIS — G4733 Obstructive sleep apnea (adult) (pediatric): Secondary | ICD-10-CM | POA: Diagnosis not present

## 2017-08-28 DIAGNOSIS — Z9989 Dependence on other enabling machines and devices: Secondary | ICD-10-CM | POA: Diagnosis not present

## 2017-08-28 DIAGNOSIS — I10 Essential (primary) hypertension: Secondary | ICD-10-CM

## 2017-08-28 HISTORY — DX: Obstructive sleep apnea (adult) (pediatric): G47.33

## 2017-08-28 HISTORY — DX: Morbid (severe) obesity due to excess calories: E66.01

## 2017-08-28 NOTE — Progress Notes (Signed)
Cardiology Office Note:    Date:  08/28/2017   ID:  Gina Lara, DOB Jun 15, 1955, MRN [id number]  PCP:  Gina Amel, MD  Cardiologist:  No primary care provider on file.    Referring MD: Gina Amel, MD   Chief Complaint  Patient presents with  . Sleep Apnea  . Hypertension    History of Present Illness:    Gina Lara is a 62 y.o. female with a hx of with a history of CAD, hypertension and hyperlipidemia who was referred for evaluation of sleep apnea.  Due to witnessed apnea, snoring and excessive daytime sleepiness a sleep study was ordered.  She was found to have severe obstructive sleep apnea with an AHI 44.5/h with oxygen saturations as low as 71%.  She underwent CPAP titration to 15 cm H2O.  She is now here for follow-up.  She is doing well with her CPAP device and thinks that she has gotten used to it.  She tolerates the mask and feels the pressure is adequate.  Since going on CPAP she feels rested in the am and has no significant daytime sleepiness.  She does have problems with significant mouth dryness and also thinks her mask has been leaking more recently.  She does not think that he snores.     Past Medical History:  Diagnosis Date  . CAD (coronary artery disease)    a. Cath 2011: mild nonbstructive disease with 20% serial LAD stenosis, 30% diffuse apical LAD, 30% mid AV groove Cx, 40% mRCA (small vessel), LVEF 65%.   . Hyperlipidemia   . Hypertension   . Hypothyroidism   . Morbid obesity ([place]) 08/28/2017  . Obesity   . OSA (obstructive sleep apnea) 02/2017  . OSA on CPAP 08/28/2017   Severe obstructive sleep apnea with an AHI 44.5/h with oxygen saturations as low as 71%. Now on CPAP at 15 cm H2O.  . Pre-diabetes   . Seasonal allergies     Past Surgical History:  Procedure Laterality Date  . EYE SURGERY    . FOOT SURGERY    . herniated disk      Current Medications: Current Meds  Medication Sig  . aspirin 81 MG tablet Take 81 mg by mouth daily.     . carvedilol (COREG) 25 MG tablet Take 1 tablet (25 mg total) by mouth 2 (two) times daily.  . chlorthalidone (HYGROTON) 25 MG tablet Take 1 tablet (25 mg total) by mouth daily.  . [facility] ANTI-FUNGAL 2 % powder Take 1 application by mouth daily.  . [facility] SKIN TREATMENT 12 % lotion Apply 1 application topically daily.  Marland Kitchen doxycycline (VIBRAMYCIN) 100 MG capsule Take 100 mg by mouth as needed (rosacea).   . DYMISTA 137-50 MCG/ACT SUSP Place 1 spray into both nostrils daily.  . fexofenadine (ALLEGRA) 180 MG tablet Take 180 mg by mouth daily.  Marland Kitchen levothyroxine (SYNTHROID, LEVOTHROID) 137 MCG tablet Take 137 mcg by mouth daily before breakfast.  . montelukast (SINGULAIR) 10 MG tablet Take 10 mg by mouth daily.  . potassium chloride (KLOR-CON) 20 MEQ packet Take 40 mEq by mouth 2 (two) times daily. PCP changed to 20 MEQ BID  . potassium chloride SA (K-DUR,KLOR-CON) 20 MEQ tablet Take 1 tablet (20 mEq total) by mouth daily.  Marland Kitchen PROAIR HFA 108 (90 Base) MCG/ACT inhaler Inhale 1-2 puffs into the lungs every 4 (four) hours as needed. Wheezing or shortness of breath  . rosuvastatin (CRESTOR) 10 MG tablet Take 1 tablet (10 mg total)  by mouth daily.     Allergies:   Amoxicillin; Ace inhibitors; Amlodipine; Angiotensin receptor blockers; and Erythromycin   Social History   Socioeconomic History  . Marital status: Single    Spouse name: Not on file  . Number of children: Not on file  . Years of education: Not on file  . Highest education level: Not on file  Occupational History  . Not on file  Social Needs  . Financial resource strain: Not on file  . Food insecurity:    Worry: Not on file    Inability: Not on file  . Transportation needs:    Medical: Not on file    Non-medical: Not on file  Tobacco Use  . Smoking status: Never Smoker  . Smokeless tobacco: Never Used  Substance and Sexual Activity  . Alcohol use: Yes    Comment: rare  . Drug use: No  . Sexual activity: Not on file  Lifestyle    . Physical activity:    Days per week: Not on file    Minutes per session: Not on file  . Stress: Not on file  Relationships  . Social connections:    Talks on phone: Not on file    Gets together: Not on file    Attends religious service: Not on file    Active member of club or organization: Not on file    Attends meetings of clubs or organizations: Not on file    Relationship status: Not on file  Other Topics Concern  . Not on file  Social History Narrative  . Not on file     Family History: The patient's family history includes Heart attack in her brother, father, and mother; Hypertension in her brother, father, and mother.  ROS:   Please see the history of present illness.    ROS  All other systems reviewed and negative.   EKGs/Labs/Other Studies Reviewed:    The following studies were reviewed today: PAP download  EKG:  EKG is not ordered today.   Recent Labs: 02/12/2017: NT-Pro BNP 67 02/19/2017: BUN 14; Creatinine, Ser 0.74; Magnesium 2.2; Potassium 3.5; Sodium 142   Recent Lipid Panel    Component Value Date/Time   CHOL 129 09/30/2014 1044   TRIG 52.0 09/30/2014 1044   HDL 48.10 09/30/2014 1044   CHOLHDL 3 09/30/2014 1044   VLDL 10.4 09/30/2014 1044   LDLCALC 70 09/30/2014 1044    Physical Exam:    VS:  BP 130/72 (BP Location: Left Arm, Patient Position: Sitting, Cuff Size: Large)   Pulse 75   Ht 5\' 4"  (1.626 m)   Wt 227 lb 12.8 oz (103.3 kg)   SpO2 95%   BMI 39.10 kg/m     Wt Readings from Last 3 Encounters:  08/28/17 227 lb 12.8 oz (103.3 kg)  03/23/17 221 lb 12.8 oz (100.6 kg)  03/01/17 220 lb (99.8 kg)     GEN:  Well nourished, well developed in no acute distress HEENT: Normal NECK: No JVD; No carotid bruits LYMPHATICS: No lymphadenopathy CARDIAC: RRR, no murmurs, rubs, gallops RESPIRATORY:  Clear to auscultation without rales, wheezing or rhonchi  ABDOMEN: Soft, non-tender, non-distended MUSCULOSKELETAL:  No edema; No deformity   SKIN: Warm and dry NEUROLOGIC:  Alert and oriented x 3 PSYCHIATRIC:  Normal affect   ASSESSMENT:    1. OSA on CPAP   2. HYPERTENSION, BENIGN   3. Morbid obesity ([place])    PLAN:    In order of problems listed above:  1.  OSA - the patient is tolerating PAP therapy well without any problems. The PAP download was reviewed today and showed an AHI of 2.7/hr on auto PAP with 77% compliance in using more than 4 hours nightly.  The patient has been using and benefiting from PAP use and will continue to benefit from therapy.  Encouraged her to try to adjust her humidity dial to help with mouth dryness.  I also encouraged her to talk with her DME company about possibly getting a new cushion since her mask is leaking.  We did talk about other mask options including nasal pillow mask and a nasal mask which she is going to keep with her current full facemask for now.  2.  HTN -blood pressures well controlled on exam today.  She will continue on carvedilol 25 mg twice daily, chlorthalidone 25 mg daily.  3.  Morbid obesity - I have encouraged her to get into a routine exercise program and cut back on carbs and portions.    Medication Adjustments/Labs and Tests Ordered: Current medicines are reviewed at length with the patient today.  Concerns regarding medicines are outlined above.  No orders of the defined types were placed in this encounter.  No orders of the defined types were placed in this encounter.   Signed, Fransico Him, MD  08/28/2017 12:28 PM    [place]

## 2017-08-28 NOTE — Patient Instructions (Signed)
Medication Instructions:  Your physician recommends that you continue on your current medications as directed. Please refer to the Current Medication list given to you today.  Labwork: NONE  Testing/Procedures: NONE  Follow-Up: Your physician wants you to follow-up in: 6 months with Dr. Radford Pax. You will receive a reminder letter in the mail two months in advance. If you don't receive a letter, please call our office to schedule the follow-up appointment.   Any Other Special Instructions Will Be Listed Below (If Applicable).     If you need a refill on your cardiac medications before your next appointment, please call your pharmacy.

## 2017-08-29 ENCOUNTER — Other Ambulatory Visit: Payer: Self-pay

## 2017-08-29 ENCOUNTER — Encounter: Payer: Self-pay | Admitting: Gastroenterology

## 2017-08-29 ENCOUNTER — Ambulatory Visit (AMBULATORY_SURGERY_CENTER): Payer: Self-pay | Admitting: *Deleted

## 2017-08-29 VITALS — Ht 64.0 in | Wt 230.0 lb

## 2017-08-29 DIAGNOSIS — Z1211 Encounter for screening for malignant neoplasm of colon: Secondary | ICD-10-CM

## 2017-08-29 MED ORDER — SOD PICOSULFATE-MAG OX-CIT ACD 10-3.5-12 MG-GM -GM/160ML PO SOLN
1.0000 | ORAL | 0 refills | Status: DC
Start: 2017-08-29 — End: 2017-09-12

## 2017-08-29 NOTE — Progress Notes (Signed)
No egg or soy allergy known to patient  No issues with past sedation with any surgeries  or procedures, no intubation problems  No diet pills per patient No home 02 use per patient  No blood thinners per patient  Pt denies issues with constipation  No A fib or A flutter  EMMI video sent to pt's e mail - pt declined  Pay as little as $40 Clenpiq coupon to pt in pV today

## 2017-09-12 ENCOUNTER — Ambulatory Visit (AMBULATORY_SURGERY_CENTER): Payer: BC Managed Care – PPO | Admitting: Gastroenterology

## 2017-09-12 ENCOUNTER — Encounter: Payer: Self-pay | Admitting: Gastroenterology

## 2017-09-12 VITALS — BP 113/53 | HR 59 | Temp 98.2°F | Resp 17 | Ht 64.0 in | Wt 227.0 lb

## 2017-09-12 DIAGNOSIS — K635 Polyp of colon: Secondary | ICD-10-CM | POA: Diagnosis not present

## 2017-09-12 DIAGNOSIS — D125 Benign neoplasm of sigmoid colon: Secondary | ICD-10-CM | POA: Diagnosis not present

## 2017-09-12 DIAGNOSIS — Z1211 Encounter for screening for malignant neoplasm of colon: Secondary | ICD-10-CM | POA: Diagnosis present

## 2017-09-12 DIAGNOSIS — D124 Benign neoplasm of descending colon: Secondary | ICD-10-CM

## 2017-09-12 MED ORDER — SODIUM CHLORIDE 0.9 % IV SOLN: 500.0000 mL | Freq: Once | INTRAVENOUS | Status: DC

## 2017-09-12 NOTE — Op Note (Signed)
[facility] Patient Name: Gina Lara Procedure Date: 09/12/2017 3:18 PM MRN: [id number] Endoscopist: Jackquline Denmark , MD Age: 62 Referring MD:  Date of Birth: 06-22-1955 Gender: Female Account #: [id number] Procedure:                Colonoscopy Indications:              Screening for colorectal malignant neoplasm Medicines:                Monitored Anesthesia Care Procedure:                Pre-Anesthesia Assessment:                           - Prior to the procedure, a History and Physical                            was performed, and patient medications and                            allergies were reviewed. The patient's tolerance of                            previous anesthesia was also reviewed. The risks                            and benefits of the procedure and the sedation                            options and risks were discussed with the patient.                            All questions were answered, and informed consent                            was obtained. Prior Anticoagulants: The patient has                            taken aspirin, last dose was day of procedure. ASA                            Grade Assessment: II - A patient with mild systemic                            disease. After reviewing the risks and benefits,                            the patient was deemed in satisfactory condition to                            undergo the procedure.                           After obtaining informed consent, the colonoscope  was passed under direct vision. Throughout the                            procedure, the patient's blood pressure, pulse, and                            oxygen saturations were monitored continuously. The                            Colonoscope was introduced through the anus and                            advanced to the the cecum, identified by                            appendiceal orifice and ileocecal  valve. The                            colonoscopy was performed without difficulty. The                            patient tolerated the procedure well. The quality                            of the bowel preparation was good. Scope In: 3:21:38 PM Scope Out: 3:39:05 PM Scope Withdrawal Time: 0 hours 13 minutes 20 seconds  Total Procedure Duration: 0 hours 17 minutes 27 seconds  Findings:                 A 4 mm polyp was found in the proximal descending                            colon. The polyp was sessile. The polyp was removed                            with a cold biopsy forceps. Resection and retrieval                            were complete. Estimated blood loss was minimal.                           A 8 mm polyp was found in the proximal sigmoid                            colon. The polyp was sessile. The polyp was removed                            with a hot snare. Resection and retrieval were                            complete. Estimated blood loss: none.  Non-bleeding internal hemorrhoids were found during                            retroflexion. The hemorrhoids were small.                           The exam was otherwise without abnormality on                            direct and retroflexion views. Complications:            No immediate complications. Estimated Blood Loss:     Estimated blood loss was minimal. Impression:               - Colonic polyps status post polypectomy.                           - Non-bleeding internal hemorrhoids.                           - The examination was otherwise normal on direct                            and retroflexion views. Recommendation:           - Patient has a contact number available for                            emergencies. The signs and symptoms of potential                            delayed complications were discussed with the                            patient. Return to normal activities  tomorrow.                            Written discharge instructions were provided to the                            patient.                           - Resume previous diet.                           - Continue present medications.                           - Await pathology results.                           - Repeat colonoscopy for surveillance based on path.                           - Return to GI clinic PRN. Jackquline Denmark, MD 09/12/2017 3:45:27 PM This report has been signed electronically.

## 2017-09-12 NOTE — Progress Notes (Signed)
Pt's states no medical or surgical changes since previsit or office visit. 

## 2017-09-12 NOTE — Patient Instructions (Signed)
YOU HAD AN ENDOSCOPIC PROCEDURE TODAY AT [facility]:   Refer to the procedure report that was given to you for any specific questions about what was found during the examination.  If the procedure report does not answer your questions, please call your gastroenterologist to clarify.  If you requested that your care partner not be given the details of your procedure findings, then the procedure report has been included in a sealed envelope for you to review at your convenience later.  YOU SHOULD EXPECT: Some feelings of bloating in the abdomen. Passage of more gas than usual.  Walking can help get rid of the air that was put into your GI tract during the procedure and reduce the bloating. If you had a lower endoscopy (such as a colonoscopy or flexible sigmoidoscopy) you may notice spotting of blood in your stool or on the toilet paper. If you underwent a bowel prep for your procedure, you may not have a normal bowel movement for a few days.  Please Note:  You might notice some irritation and congestion in your nose or some drainage.  This is from the oxygen used during your procedure.  There is no need for concern and it should clear up in a day or so.  SYMPTOMS TO REPORT IMMEDIATELY:   Following lower endoscopy (colonoscopy or flexible sigmoidoscopy):  Excessive amounts of blood in the stool  Significant tenderness or worsening of abdominal pains  Swelling of the abdomen that is new, acute  Fever of 100F or higher   For urgent or emergent issues, a gastroenterologist can be reached at any hour by calling [phone].   DIET:  We do recommend a small meal at first, but then you may proceed to your regular diet.  Drink plenty of fluids but you should avoid alcoholic beverages for 24 hours.  ACTIVITY:  You should plan to take it easy for the rest of today and you should NOT DRIVE or use heavy machinery until tomorrow (because of the sedation medicines uased during the test).     FOLLOW UP: Our staff will call the number listed on your records the next business day following your procedure to check on you and address any questions or concerns that you may have regarding the information given to you following your procedure. If we do not reach you, we will leave a message.  However, if you are feeling well and you are not experiencing any problems, there is no need to return our call.  We will assume that you have returned to your regular daily activities without incident.  If any biopsies were taken you will be contacted by phone or by letter within the next 1-3 weeks.  Please call us at [phone] if you have not heard about the biopsies in 3 weeks.    SIGNATURES/CONFIDENTIALITY: You and/or your care partner have signed paperwork which will be entered into your electronic medical record.  These signatures attest to the fact that that the information above on your After Visit Summary has been reviewed and is understood.  Full responsibility of the confidentiality of this discharge information lies with you and/or your care-partner.   Polyp,hemorrhoid information given

## 2017-09-12 NOTE — Progress Notes (Signed)
Called to room to assist during endoscopic procedure.  Patient ID and intended procedure confirmed with present staff. Received instructions for my participation in the procedure from the performing physician.  

## 2017-09-13 ENCOUNTER — Telehealth: Payer: Self-pay

## 2017-09-13 NOTE — Telephone Encounter (Signed)
  Follow up Call-  Call Retal Tonkinson number 09/12/2017  Post procedure Call Liane Tribbey phone  # [phone]  Permission to leave phone message Yes  Some recent data might be hidden     Patient questions:  Do you have a fever, pain , or abdominal swelling? No. Pain Score  0 *  Have you tolerated food without any problems? Yes.    Have you been able to return to your normal activities? Yes.    Do you have any questions about your discharge instructions: Diet   No. Medications  No. Follow up visit  No.  Do you have questions or concerns about your Care? No.  Actions: * If pain score is 4 or above: No action needed, pain <4.

## 2017-09-19 ENCOUNTER — Encounter: Payer: Self-pay | Admitting: Obstetrics & Gynecology

## 2017-09-19 ENCOUNTER — Ambulatory Visit: Payer: BC Managed Care – PPO | Admitting: Obstetrics & Gynecology

## 2017-09-19 VITALS — BP 132/84 | Ht 63.25 in | Wt 229.0 lb

## 2017-09-19 DIAGNOSIS — Z01419 Encounter for gynecological examination (general) (routine) without abnormal findings: Secondary | ICD-10-CM

## 2017-09-19 DIAGNOSIS — B372 Candidiasis of skin and nail: Secondary | ICD-10-CM

## 2017-09-19 DIAGNOSIS — Z6841 Body Mass Index (BMI) 40.0 and over, adult: Secondary | ICD-10-CM | POA: Diagnosis not present

## 2017-09-19 DIAGNOSIS — Z780 Asymptomatic menopausal state: Secondary | ICD-10-CM | POA: Diagnosis not present

## 2017-09-19 DIAGNOSIS — Z13820 Encounter for screening for osteoporosis: Secondary | ICD-10-CM | POA: Diagnosis not present

## 2017-09-19 DIAGNOSIS — E66813 Obesity, class 3: Secondary | ICD-10-CM

## 2017-09-19 MED ORDER — TERCONAZOLE 0.8 % VA CREA: 1.0000 | TOPICAL_CREAM | Freq: Every day | VAGINAL | 1 refills | Status: AC

## 2017-09-19 NOTE — Progress Notes (Signed)
Gina Lara 17-Oct-1955 [id number]   History:    62 y.o. G0 Single.  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  RP:  Established patient presenting for annual gyn exam   HPI: Menopause, well on no HRT.  No PBM.  No pelvic pain.  H/O Lichen Sclerosus, but well without Clobetasol applications x >1 year.  Had many URTI episodes and allergic reactions/Asthma reactions in last 6 months.  Irritation at right inguinal area.  Urine and bowel movements normal.  Breasts normal.  Body mass index 40.25.  Health labs with family physician.  Past medical history,surgical history, family history and social history were all reviewed and documented in the EPIC chart.  Gynecologic History Patient's last menstrual period was 07/29/2010. Contraception: post menopausal status Last Pap: >5 yrs, normal Last mammogram: 11/2016. Results were: Negative Bone Density: Never Colonoscopy: 2019  Obstetric History OB History  Gravida Para Term Preterm AB Living  0 0 0 0 0 0  SAB TAB Ectopic Multiple Live Births  0 0 0 0 0     ROS: A ROS was performed and pertinent positives and negatives are included in the history.  GENERAL: No fevers or chills. HEENT: No change in vision, no earache, sore throat or sinus congestion. NECK: No pain or stiffness. CARDIOVASCULAR: No chest pain or pressure. No palpitations. PULMONARY: No shortness of breath, cough or wheeze. GASTROINTESTINAL: No abdominal pain, nausea, vomiting or diarrhea, melena or bright red blood per rectum. GENITOURINARY: No urinary frequency, urgency, hesitancy or dysuria. MUSCULOSKELETAL: No joint or muscle pain, no back pain, no recent trauma. DERMATOLOGIC: No rash, no itching, no lesions. ENDOCRINE: No polyuria, polydipsia, no heat or cold intolerance. No recent change in weight. HEMATOLOGICAL: No anemia or easy bruising or bleeding. NEUROLOGIC: No headache, seizures, numbness, tingling or weakness. PSYCHIATRIC: No depression, no loss of interest in normal  activity or change in sleep pattern.     Exam:   BP 132/84   Ht 5' 3.25" (1.607 m)   Wt 229 lb (103.9 kg)   LMP 07/29/2010   BMI 40.25 kg/m   Body mass index is 40.25 kg/m.  General appearance : Well developed well nourished female. No acute distress HEENT: Eyes: no retinal hemorrhage or exudates,  Neck supple, trachea midline, no carotid bruits, no thyroidmegaly Lungs: Clear to auscultation, no rhonchi or wheezes, or rib retractions  Heart: Regular rate and rhythm, no murmurs or gallops Breast:Examined in sitting and supine position were symmetrical in appearance, no palpable masses or tenderness,  no skin retraction, no nipple inversion, no nipple discharge, no skin discoloration, no axillary or supraclavicular lymphadenopathy Abdomen: no palpable masses or tenderness, no rebound or guarding Extremities: no edema or skin discoloration or tenderness  Pelvic: Vulva: Normal.  Erythema at the right inguinal area compatible with a yeast infection.             Vagina: No gross lesions or discharge  Cervix: No gross lesions or discharge.  Pap reflex done.  Uterus  AV, normal size, shape and consistency, non-tender and mobile  Adnexa  Without masses or tenderness  Anus: Normal   Assessment/Plan:  62 y.o. female for annual exam   1. Encounter for routine gynecological examination with Papanicolaou smear of cervix Normal gynecologic exam.  Pap reflex done.  Breast exam normal.  Will schedule next screening mammography in October 2019.  Health labs with family physician.  Colonoscopy in 2019.  2. Menopause present Well on no hormone replacement therapy.  No postmenopausal bleeding.  3. Yeast infection of the skin Will treat with terconazole cream.  Apply a thin layer on the affected area at bedtime for 3 days.  4. Screening for osteoporosis Will schedule bone density here now.  Recommend calcium intake 1.2 g/day including diet and supplements, vitamin D supplements and regular  weightbearing physical activity. - DG Bone Density; Future  5. Class 3 severe obesity due to excess calories without serious comorbidity with body mass index (BMI) of 40.0 to 44.9 in adult [facility]) Recommend lower calorie/carb diet such as [employer].  Increase aerobic activities to 5 times a week and weightlifting every 2 days.  Other orders - terconazole (TERAZOL 3) 0.8 % vaginal cream; Place 1 applicator vaginally at bedtime for 3 days. Right inguinal area.  Counseling on above issues and coordination of care more than 50% for 10 minutes.  Princess Bruins MD, 2:14 PM 09/19/2017

## 2017-09-20 LAB — PAP IG W/ RFLX HPV ASCU

## 2017-09-21 ENCOUNTER — Encounter: Payer: Self-pay | Admitting: Obstetrics & Gynecology

## 2017-09-21 NOTE — Patient Instructions (Addendum)
1. Encounter for routine gynecological examination with Papanicolaou smear of cervix Normal gynecologic exam.  Pap reflex done.  Breast exam normal.  Will schedule next screening mammography in October 2019.  Health labs with family physician.  Colonoscopy in 2019.  2. Menopause present Well on no hormone replacement therapy.  No postmenopausal bleeding.  3. Yeast infection of the skin Will treat with terconazole cream.  Apply a thin layer on the affected area at bedtime for 3 days.  4. Screening for osteoporosis Will schedule bone density here now.  Recommend calcium intake 1.2 g/day including diet and supplements, vitamin D supplements and regular weightbearing physical activity. - DG Bone Density; Future  5. Class 3 severe obesity due to excess calories without serious comorbidity with body mass index (BMI) of 40.0 to 44.9 in adult [facility]) Recommend lower calorie/carb diet such as [employer].  Increase aerobic activities to 5 times a week and weightlifting every 2 days.  Other orders - terconazole (TERAZOL 3) 0.8 % vaginal cream; Place 1 applicator vaginally at bedtime for 3 days. Right inguinal area.  Gina Lara, it was a pleasure seeing you today!  I will inform you of your results as soon as they are available.

## 2017-09-27 ENCOUNTER — Encounter: Payer: Self-pay | Admitting: Gastroenterology

## 2017-09-28 ENCOUNTER — Telehealth: Payer: Self-pay | Admitting: Gastroenterology

## 2017-09-28 NOTE — Telephone Encounter (Signed)
I answered all of her questions related to the path report.

## 2017-10-18 ENCOUNTER — Encounter: Payer: Self-pay | Admitting: Cardiovascular Disease

## 2017-10-18 ENCOUNTER — Ambulatory Visit: Payer: BC Managed Care – PPO | Admitting: Cardiovascular Disease

## 2017-10-18 VITALS — BP 120/78 | HR 78 | Ht 63.25 in | Wt 229.8 lb

## 2017-10-18 DIAGNOSIS — G4733 Obstructive sleep apnea (adult) (pediatric): Secondary | ICD-10-CM

## 2017-10-18 DIAGNOSIS — I10 Essential (primary) hypertension: Secondary | ICD-10-CM | POA: Diagnosis not present

## 2017-10-18 DIAGNOSIS — I2510 Atherosclerotic heart disease of native coronary artery without angina pectoris: Secondary | ICD-10-CM

## 2017-10-18 DIAGNOSIS — E785 Hyperlipidemia, unspecified: Secondary | ICD-10-CM

## 2017-10-18 NOTE — Patient Instructions (Signed)

## 2017-10-18 NOTE — Progress Notes (Signed)
Chief Complaint  Patient presents with  . Follow-up    CAD     History of Present Illness: 62 yo female with history of CAD, HTN, sleep apnea, morbid obesity and hypothyroidism who is here today for follow up. She was seen as a new patient on 03/30/09. At the first visit, she complained of left sided and substernal chest pressure. There was associated nausea and radiation of the pain into the left arm. Because of her strong family history, risk factors and story, I arranged a diagnostic left heart cath on 03/31/09. This showed normal LV function with mild non-obstructive CAD. She did not tolerate ARBs, Ace-inh or Norvasc. She is now on Coreg and doing well.   She is here today for follow up. The patient denies any chest pain, dyspnea, palpitations, lower extremity edema, orthopnea, PND, dizziness, near syncope or syncope.    Primary Care Physician: Lujean Amel, MD  Past Medical History:  Diagnosis Date  . Allergy   . Bronchitis   . CAD (coronary artery disease)    a. Cath 2011: mild nonbstructive disease with 20% serial LAD stenosis, 30% diffuse apical LAD, 30% mid AV groove Cx, 40% mRCA (small vessel), LVEF 65%.   . Hyperlipidemia   . Hypertension   . Hypothyroidism   . Morbid obesity ([place]) 08/28/2017  . Obesity   . OSA (obstructive sleep apnea) 02/2017  . OSA on CPAP 08/28/2017   Severe obstructive sleep apnea with an AHI 44.5/h with oxygen saturations as low as 71%. Now on CPAP at 15 cm H2O.  . Pre-diabetes   . Seasonal allergies   . Sleep apnea    wears cpap  . Syncope    passes out when has no fresh air     Past Surgical History:  Procedure Laterality Date  . CARDIAC CATHETERIZATION    . COLONOSCOPY    . EYE SURGERY    . FOOT SURGERY    . herniated disk      Current Outpatient Medications  Medication Sig Dispense Refill  . aspirin 81 MG tablet Take 81 mg by mouth daily.      . carvedilol (COREG) 25 MG tablet Take 25 mg by mouth 2 (two) times daily.    .  chlorthalidone (HYGROTON) 25 MG tablet Take 25 mg by mouth daily.    Marland Kitchen doxycycline (VIBRAMYCIN) 100 MG capsule Take 100 mg by mouth as needed (rosacea).   2  . DYMISTA 137-50 MCG/ACT SUSP Place 1 spray into both nostrils daily.  3  . fexofenadine (ALLEGRA) 180 MG tablet Take 180 mg by mouth daily.    Marland Kitchen levothyroxine (SYNTHROID, LEVOTHROID) 137 MCG tablet Take 137 mcg by mouth daily before breakfast.    . montelukast (SINGULAIR) 10 MG tablet Take 10 mg by mouth daily.  3  . potassium chloride (KLOR-CON) 20 MEQ packet Take 40 mEq by mouth 2 (two) times daily. PCP changed to 20 MEQ BID    . PROAIR HFA 108 (90 Base) MCG/ACT inhaler Inhale 1-2 puffs into the lungs every 4 (four) hours as needed. Wheezing or shortness of breath    . rosuvastatin (CRESTOR) 10 MG tablet Take 1 tablet (10 mg total) by mouth daily. 90 tablet 3   No current facility-administered medications for this visit.     Allergies  Allergen Reactions  . Amoxicillin Swelling and Hives    REACTION: rash  . Ace Inhibitors Swelling    Angioedema in 2018  . Amlodipine     Edema  at 5-10mg  doses  . Angiotensin Receptor Blockers     Angioedema in 2018 (irbesartan, olmesartan)  . Lisinopril Other (See Comments)    Edema   . Erythromycin Nausea Only and Other (See Comments)    Stomach upset GI UPSET AND STOMACH BURNING GI UPSET AND STOMACH BURNING    Social History   Socioeconomic History  . Marital status: Single    Spouse name: Not on file  . Number of children: Not on file  . Years of education: Not on file  . Highest education level: Not on file  Occupational History  . Not on file  Social Needs  . Financial resource strain: Not on file  . Food insecurity:    Worry: Not on file    Inability: Not on file  . Transportation needs:    Medical: Not on file    Non-medical: Not on file  Tobacco Use  . Smoking status: Never Smoker  . Smokeless tobacco: Never Used  Substance and Sexual Activity  . Alcohol use: Yes      Comment: rare  . Drug use: No  . Sexual activity: Never  Lifestyle  . Physical activity:    Days per week: Not on file    Minutes per session: Not on file  . Stress: Not on file  Relationships  . Social connections:    Talks on phone: Not on file    Gets together: Not on file    Attends religious service: Not on file    Active member of club or organization: Not on file    Attends meetings of clubs or organizations: Not on file    Relationship status: Not on file  . Intimate partner violence:    Fear of current or ex partner: Not on file    Emotionally abused: Not on file    Physically abused: Not on file    Forced sexual activity: Not on file  Other Topics Concern  . Not on file  Social History Narrative  . Not on file    Family History  Problem Relation Age of Onset  . Heart attack Mother   . Hypertension Mother   . Heart attack Father   . Hypertension Father   . Heart attack Brother   . Hypertension Brother   . Colon cancer Neg Hx   . Colon polyps Neg Hx   . Esophageal cancer Neg Hx   . Rectal cancer Neg Hx   . Stomach cancer Neg Hx     Review of Systems:  As stated in the HPI and otherwise negative.   BP 120/78   Pulse 78   Ht 5' 3.25" (1.607 m)   Wt 229 lb 12.8 oz (104.2 kg)   LMP 07/29/2010   SpO2 96%   BMI 40.39 kg/m   Physical Examination:  General: Well developed, well nourished, NAD  HEENT: OP clear, mucus membranes moist  SKIN: warm, dry. No rashes. Neuro: No focal deficits  Musculoskeletal: Muscle strength 5/5 all ext  Psychiatric: Mood and affect normal  Neck: No JVD, no carotid bruits, no thyromegaly, no lymphadenopathy.  Lungs:Clear bilaterally, no wheezes, rhonci, crackles Cardiovascular: Regular rate and rhythm. No murmurs, gallops or rubs. Abdomen:Soft. Bowel sounds present. Non-tender.  Extremities: No lower extremity edema. Pulses are 2 + in the bilateral DP/PT.  EKG:  EKG is ordered today. The ekg ordered today demonstrates    Recent Labs: 02/12/2017: NT-Pro BNP 67 02/19/2017: BUN 14; Creatinine, Ser 0.74; Magnesium 2.2; Potassium 3.5; Sodium  142   Lipid Panel    Component Value Date/Time   CHOL 129 09/30/2014 1044   TRIG 52.0 09/30/2014 1044   HDL 48.10 09/30/2014 1044   CHOLHDL 3 09/30/2014 1044   VLDL 10.4 09/30/2014 1044   LDLCALC 70 09/30/2014 1044     Wt Readings from Last 3 Encounters:  10/18/17 229 lb 12.8 oz (104.2 kg)  09/19/17 229 lb (103.9 kg)  09/12/17 227 lb (103 kg)     Other studies Reviewed: Additional studies/ records that were reviewed today include: . Review of the above records demonstrates:    Assessment and Plan:   1. CAD without angina: No chest pain. Will continue ASA, statin and beta blocker.    2. HTN: BP is controlled. No changes  3. HLD: Lipids followed in primary care. Continue statin.   4. Sleep apnea: She is on CPAP and followed by Dr. Radford Pax  5. Morbid obesity: Weight loss, diet and exercise encouraged.   Current medicines are reviewed at length with the patient today.  The patient does not have concerns regarding medicines.  The following changes have been made:  no change  Labs/ tests ordered today include:   No orders of the defined types were placed in this encounter.   Disposition:   FU with me in 12  months  Signed, Lauree Chandler, MD 10/18/2017 4:56 PM    [facility] [address], [place], [place]  [address] Phone: [phone]; Fax: [phone]

## 2017-10-19 ENCOUNTER — Telehealth: Payer: Self-pay | Admitting: Cardiovascular Disease

## 2017-10-19 NOTE — Telephone Encounter (Signed)
I spoke with pt. She reports she and Dr. Angelena Form discussed her foot/ankle/leg swelling at recent office visit. Possibility of taking lasix discussed but pt did not want to start at that time. Is on chlorthalidone. Pt reports she now thinks she may need lasix. She reports having several pairs of shoes she cannot wear due to swelling. Swelling is present in the morning and worsens during the day.  Knees have been painful for the past several months. Will forward to Dr. Angelena Form for review/recommendations.

## 2017-10-19 NOTE — Telephone Encounter (Signed)
Left message to call back  

## 2017-10-19 NOTE — Telephone Encounter (Signed)
° ° ° ° °  Pt c/o medication issue:  1. Name of Medication: Lasix  2. How are you currently taking this medication (dosage and times per day)? n/a  3. Are you having a reaction (difficulty breathing--STAT)? No 4. What is your medication issue? Patient wants to discuss using medication

## 2017-10-23 NOTE — Telephone Encounter (Signed)
OK to start Lasix 40 mg as needed for swelling.   Thanks, Gerald Stabs

## 2017-10-24 NOTE — Telephone Encounter (Signed)
Reviewed with Fuller Canada, PharmD and pt should continue chlorthalidone.  Will need BMET in 2 weeks.  At that time need to determine how often pt is taking prn lasix. If every day may need to change chlorthalidone.  I placed call to pt and left message to call back

## 2017-11-01 MED ORDER — FUROSEMIDE 40 MG PO TABS: ORAL_TABLET | ORAL | 6 refills | Status: DC

## 2017-11-01 NOTE — Telephone Encounter (Signed)
I spoke with pt and gave her information from Dr. Angelena Form and pharmacist. She does not think she will need to use lasix daily. Pt is having lab work done at primary care in 2 weeks and will have result sent to our office. Will send prescription for lasix to [facility] on [address].

## 2017-11-05 ENCOUNTER — Encounter: Payer: Self-pay | Admitting: Obstetrics & Gynecology

## 2017-11-05 ENCOUNTER — Ambulatory Visit (INDEPENDENT_AMBULATORY_CARE_PROVIDER_SITE_OTHER): Payer: BC Managed Care – PPO

## 2017-11-05 DIAGNOSIS — Z13820 Encounter for screening for osteoporosis: Secondary | ICD-10-CM

## 2017-11-14 ENCOUNTER — Encounter: Payer: Self-pay | Admitting: Obstetrics & Gynecology

## 2017-11-15 ENCOUNTER — Encounter: Payer: Self-pay | Admitting: Obstetrics & Gynecology

## 2017-12-23 ENCOUNTER — Encounter

## 2018-01-20 ENCOUNTER — Other Ambulatory Visit: Payer: Self-pay | Admitting: Physician Assistant

## 2018-02-13 HISTORY — PX: TRIGGER FINGER RELEASE: SHX641

## 2018-04-23 ENCOUNTER — Other Ambulatory Visit: Payer: Self-pay | Admitting: Cardiovascular Disease

## 2018-04-25 ENCOUNTER — Other Ambulatory Visit: Payer: Self-pay | Admitting: Cardiovascular Disease

## 2018-11-29 ENCOUNTER — Encounter: Payer: Self-pay | Admitting: Obstetrics & Gynecology

## 2018-12-02 ENCOUNTER — Telehealth: Payer: Self-pay | Admitting: Cardiovascular Disease

## 2018-12-02 NOTE — Telephone Encounter (Signed)
Spoke with patient.  Reviewed cardiac meds/is taking as listed. She is experiencing occasional dizziness and occasional chest pressure that she feels is r/t elevated BPs and stress. 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 Chest pressure does not come on with any certain activity and is not relieved by any particular thing.  Sleeping well. No SOB. She thinks much of her issue is r/t stress but worries due to strong family history of early CAD.  She is due for yearly f/u with Dr. Angelena Form.  Scheduled her for 12/04/18.  Last lipids checked 2018.  Adv to try to take short breaks and get outside for short walks and also adv if chest pressure worsens or is accompanied by other symptoms or does not go away that she should call EMS to go to hospital for evaluation.  Pt in agreement and appreciative for assistance provided today.

## 2018-12-02 NOTE — Telephone Encounter (Signed)
.  Pt c/o BP issue: STAT if pt c/o blurred vision, one-sided weakness or slurred speech  1. What are your last 5 BP readings 143/89, 147/90 last night, 153/96 last Monday  2. Are you having any other symptoms (ex. Dizziness, headache, blurred vision, passed out)?  Dizziness, headache and earache- a lot last week, not today   . What is your BP issue? High Blood Pressure

## 2018-12-03 NOTE — Telephone Encounter (Signed)
Thanks

## 2018-12-04 ENCOUNTER — Ambulatory Visit: Payer: BC Managed Care – PPO | Admitting: Cardiovascular Disease

## 2018-12-04 ENCOUNTER — Other Ambulatory Visit: Payer: Self-pay

## 2018-12-04 ENCOUNTER — Encounter: Payer: Self-pay | Admitting: Cardiovascular Disease

## 2018-12-04 VITALS — BP 134/76 | HR 68 | Ht 64.0 in | Wt 229.4 lb

## 2018-12-04 DIAGNOSIS — I10 Essential (primary) hypertension: Secondary | ICD-10-CM

## 2018-12-04 DIAGNOSIS — I2510 Atherosclerotic heart disease of native coronary artery without angina pectoris: Secondary | ICD-10-CM

## 2018-12-04 NOTE — Patient Instructions (Signed)
Medication Instructions:  No changes *If you need a refill on your cardiac medications before your next appointment, please call your pharmacy*  Lab Work: none If you have labs (blood work) drawn today and your tests are completely normal, you will receive your results only by: Marland Kitchen MyChart Message (if you have MyChart) OR . A paper copy in the mail If you have any lab test that is abnormal or we need to change your treatment, we will call you to review the results.  Testing/Procedures: noe  Follow-Up: At [facility], you and your health needs are our priority.  As part of our continuing mission to provide you with exceptional heart care, we have created designated Provider Care Teams.  These Care Teams include your primary Cardiologist (physician) and Advanced Practice Providers (APPs -  Physician Assistants and Nurse Practitioners) who all work together to provide you with the care you need, when you need it.  Your next appointment:   6 months  The format for your next appointment:   In Person  Provider:   Lauree Chandler, MD  Other Instructions Check blood pressure once a day for a week and call or use MyChart to let Dr. Angelena Form know what they are.

## 2018-12-04 NOTE — Progress Notes (Signed)
Chief Complaint  Patient presents with  . Follow-up    CAD     History of Present Illness: 63 yo female with history of CAD, HTN, sleep apnea, morbid obesity and hypothyroidism who is here today for follow up. She was seen as a new patient on 03/30/09. At the first visit, she complained of left sided and substernal chest pressure. There was associated nausea and radiation of the pain into the left arm. Because of her strong family history, risk factors and story, I arranged a diagnostic left heart cath on 03/31/09. This showed normal LV function with mild non-obstructive CAD. She did not tolerate ARBs, Ace-inh or Norvasc.   She is here today for follow up. The patient denies any chest pain, dyspnea, palpitations, lower extremity edema, orthopnea, PND, dizziness, near syncope or syncope. 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 Her BP has been 0000000 systolic at home and she is worried about this.    Primary Care Physician: Lujean Amel, MD  Past Medical History:  Diagnosis Date  . Allergy   . Bronchitis   . CAD (coronary artery disease)    a. Cath 2011: mild nonbstructive disease with 20% serial LAD stenosis, 30% diffuse apical LAD, 30% mid AV groove Cx, 40% mRCA (small vessel), LVEF 65%.   . Hyperlipidemia   . Hypertension   . Hypothyroidism   . Morbid obesity ([place]) 08/28/2017  . Obesity   . OSA (obstructive sleep apnea) 02/2017  . OSA on CPAP 08/28/2017   Severe obstructive sleep apnea with an AHI 44.5/h with oxygen saturations as low as 71%. Now on CPAP at 15 cm H2O.  . Pre-diabetes   . Seasonal allergies   . Sleep apnea    wears cpap  . Syncope    passes out when has no fresh air     Past Surgical History:  Procedure Laterality Date  . CARDIAC CATHETERIZATION    . COLONOSCOPY    . EYE SURGERY    . FOOT SURGERY    . herniated disk      Current Outpatient Medications  Medication Sig  Dispense Refill  . aspirin 81 MG tablet Take 81 mg by mouth daily.      . carvedilol (COREG) 25 MG tablet TAKE 1 TABLET BY MOUTH TWICE A DAY 180 tablet 2  . chlorthalidone (HYGROTON) 25 MG tablet TAKE 1 TABLET BY MOUTH EVERY DAY 90 tablet 2  . doxycycline (VIBRAMYCIN) 100 MG capsule Take 100 mg by mouth as needed (rosacea).   2  . DYMISTA 137-50 MCG/ACT SUSP Place 1 spray into both nostrils daily.  3  . fexofenadine (ALLEGRA) 180 MG tablet Take 180 mg by mouth daily.    . furosemide (LASIX) 40 MG tablet TAKE ONE TABLET BY MOUTH DAILY AS NEEDED FOR SWELLING 90 tablet 1  . levothyroxine (SYNTHROID, LEVOTHROID) 137 MCG tablet Take 137 mcg by mouth daily before breakfast.    . montelukast (SINGULAIR) 10 MG tablet Take 10 mg by mouth daily.  3  . potassium chloride (KLOR-CON) 20 MEQ packet Take 40 mEq by mouth 2 (two) times daily. PCP changed to 20 MEQ BID    . PROAIR HFA 108 (90 Base) MCG/ACT inhaler Inhale 1-2 puffs into the lungs every 4 (four) hours as needed. Wheezing or shortness of breath    . rosuvastatin (CRESTOR) 10 MG tablet TAKE 1 TABLET BY MOUTH EVERY DAY  90 tablet 1   No current facility-administered medications for this visit.     Allergies  Allergen Reactions  . Amoxicillin Swelling and Hives    REACTION: rash  . Ace Inhibitors Swelling    Angioedema in 2018  . Amlodipine     Edema at 5-10mg  doses  . Angiotensin Receptor Blockers     Angioedema in 2018 (irbesartan, olmesartan)  . Lisinopril Other (See Comments)    Edema   . Erythromycin Nausea Only, Other (See Comments) and Nausea And Vomiting    Stomach upset GI UPSET AND STOMACH BURNING GI UPSET AND STOMACH BURNING Stomach upset Stomach upset GI UPSET AND STOMACH BURNING GI UPSET AND STOMACH BURNING Stomach upset Stomach upset GI UPSET AND STOMACH BURNING GI UPSET AND STOMACH BURNING    Social History   Socioeconomic History  . Marital status: Single    Spouse name: Not on file  . Number of children: Not  on file  . Years of education: Not on file  . Highest education level: Not on file  Occupational History  . Not on file  Social Needs  . Financial resource strain: Not on file  . Food insecurity    Worry: Not on file    Inability: Not on file  . Transportation needs    Medical: Not on file    Non-medical: Not on file  Tobacco Use  . Smoking status: Never Smoker  . Smokeless tobacco: Never Used  Substance and Sexual Activity  . Alcohol use: Yes    Comment: rare  . Drug use: No  . Sexual activity: Never  Lifestyle  . Physical activity    Days per week: Not on file    Minutes per session: Not on file  . Stress: Not on file  Relationships  . Social Herbalist on phone: Not on file    Gets together: Not on file    Attends religious service: Not on file    Active member of club or organization: Not on file    Attends meetings of clubs or organizations: Not on file    Relationship status: Not on file  . Intimate partner violence    Fear of current or ex partner: Not on file    Emotionally abused: Not on file    Physically abused: Not on file    Forced sexual activity: Not on file  Other Topics Concern  . Not on file  Social History Narrative  . Not on file    Family History  Problem Relation Age of Onset  . Heart attack Mother   . Hypertension Mother   . Heart attack Father   . Hypertension Father   . Heart attack Brother   . Hypertension Brother   . Colon cancer Neg Hx   . Colon polyps Neg Hx   . Esophageal cancer Neg Hx   . Rectal cancer Neg Hx   . Stomach cancer Neg Hx     Review of Systems:  As stated in the HPI and otherwise negative.   BP 134/76   Pulse 68   Ht 5\' 4"  (1.626 m)   Wt 229 lb 6.4 oz (104.1 kg)   LMP 07/29/2010   SpO2 96%   BMI 39.38 kg/m   Physical Examination:  General: Well developed, well nourished, NAD  HEENT: OP clear, mucus membranes moist  SKIN: warm, dry. No rashes. Neuro: No focal deficits  Musculoskeletal:  Muscle strength 5/5 all ext  Psychiatric: Mood and  affect normal  Neck: No JVD, no carotid bruits, no thyromegaly, no lymphadenopathy.  Lungs:Clear bilaterally, no wheezes, rhonci, crackles Cardiovascular: Regular rate and rhythm. No murmurs, gallops or rubs. Abdomen:Soft. Bowel sounds present. Non-tender.  Extremities: No lower extremity edema. Pulses are 2 + in the bilateral DP/PT.  EKG:  EKG is ordered today. The ekg ordered today demonstrates NSR, rate 68 bpm. Incomplete RBBB  Recent Labs: No results found for requested labs within last 8760 hours.   Lipid Panel    Component Value Date/Time   CHOL 129 09/30/2014 1044   TRIG 52.0 09/30/2014 1044   HDL 48.10 09/30/2014 1044   CHOLHDL 3 09/30/2014 1044   VLDL 10.4 09/30/2014 1044   LDLCALC 70 09/30/2014 1044     Wt Readings from Last 3 Encounters:  12/04/18 229 lb 6.4 oz (104.1 kg)  10/18/17 229 lb 12.8 oz (104.2 kg)  09/19/17 229 lb (103.9 kg)     Other studies Reviewed: Additional studies/ records that were reviewed today include: . Review of the above records demonstrates:    Assessment and Plan:   1. CAD without angina: She has mild CAD by cath in 2011. She has no chest pain. Continue ASA, statin and beta blocker.     2. HTN: BP is well controlled here today but has been elevated at home. She will follow at home and call back in one week. If it is still elevated, will consider changing Chlorthalidone to Maxzide. She has had issues with Norvasc, ARB and Ace-inh in the past.   3. HLD: Lipids followed in primary care. Will continue statin  4. Sleep apnea: She is on CPAP and followed by Dr. Radford Pax  5. Morbid obesity: Weight loss is discussed.   Current medicines are reviewed at length with the patient today.  The patient does not have concerns regarding medicines.  The following changes have been made:  no change  Labs/ tests ordered today include:   Orders Placed This Encounter  Procedures  . EKG 12-Lead     Disposition:   FU with me in 12  months  Signed, Lauree Chandler, MD 12/04/2018 4:18 PM    [facility] [address], [place], [place]  [address] Phone: [phone]; Fax: [phone]

## 2019-04-12 ENCOUNTER — Ambulatory Visit: Payer: BC Managed Care – PPO | Attending: Internal Medicine

## 2019-04-12 DIAGNOSIS — Z23 Encounter for immunization: Secondary | ICD-10-CM | POA: Insufficient documentation

## 2019-04-12 NOTE — Progress Notes (Signed)
   Covid-19 Vaccination Clinic  Name:  Gina Lara    MRN: [id number] DOB: Apr 15, 1955  04/12/2019  [name] was observed post Covid-19 immunization for 30 minutes based on pre-vaccination screening without incidence. She was provided with Vaccine Information Sheet and instruction to access the V-Safe system.   [name] was instructed to call 911 with any severe reactions post vaccine: Marland Kitchen Difficulty breathing  . Swelling of your face and throat  . A fast heartbeat  . A bad rash all over your body  . Dizziness and weakness    Immunizations Administered    Name Date Dose VIS Date Route   Pfizer COVID-19 Vaccine 04/12/2019  2:11 PM 0.3 mL 01/24/2019 Intranasal   Manufacturer: [place]   Lot: [id number]   [place]: [id number]

## 2019-05-03 ENCOUNTER — Ambulatory Visit: Payer: BC Managed Care – PPO | Attending: Internal Medicine

## 2019-05-03 DIAGNOSIS — Z23 Encounter for immunization: Secondary | ICD-10-CM

## 2019-05-03 NOTE — Progress Notes (Signed)
   Covid-19 Vaccination Clinic  Name:  PENNI KABACINSKI    MRN: [id number] DOB: [birth year]  05/03/2019  Ms. Nield was observed post Covid-19 immunization for 30 minutes based on pre-vaccination screening without incident. She was provided with Vaccine Information Sheet and instruction to access the V-Safe system.   Ms. Murnan was instructed to call 911 with any severe reactions post vaccine: Marland Kitchen Difficulty breathing  . Swelling of face and throat  . A fast heartbeat  . A bad rash all over body  . Dizziness and weakness   Immunizations Administered    Name Date Dose VIS Date Route   Pfizer COVID-19 Vaccine 05/03/2019 10:36 AM 0.3 mL 01/24/2019 Intramuscular   Manufacturer: [place]   Lot: [id number]   [place]: [id number]

## 2019-06-30 ENCOUNTER — Ambulatory Visit: Payer: BC Managed Care – PPO | Admitting: Cardiovascular Disease

## 2019-06-30 ENCOUNTER — Encounter: Payer: Self-pay | Admitting: Cardiovascular Disease

## 2019-06-30 ENCOUNTER — Other Ambulatory Visit: Payer: Self-pay

## 2019-06-30 VITALS — BP 128/80 | HR 61 | Ht 64.0 in | Wt 219.8 lb

## 2019-06-30 DIAGNOSIS — G4733 Obstructive sleep apnea (adult) (pediatric): Secondary | ICD-10-CM

## 2019-06-30 DIAGNOSIS — I2510 Atherosclerotic heart disease of native coronary artery without angina pectoris: Secondary | ICD-10-CM

## 2019-06-30 DIAGNOSIS — I10 Essential (primary) hypertension: Secondary | ICD-10-CM | POA: Diagnosis not present

## 2019-06-30 DIAGNOSIS — E785 Hyperlipidemia, unspecified: Secondary | ICD-10-CM

## 2019-06-30 NOTE — Progress Notes (Signed)
Chief Complaint  Patient presents with  . Follow-up    CAD   History of Present Illness: 64 yo female with history of CAD, HTN, sleep apnea, morbid obesity and hypothyroidism who is here today for follow up. She was seen as a new patient on 03/30/09. At the first visit, she complained of left sided and substernal chest pressure. There was associated nausea and radiation of the pain into the left arm. Because of her strong family history, risk factors and story, I arranged a diagnostic left heart cath on 03/31/09. This showed normal LV function with mild non-obstructive CAD. She did not tolerate ARBs, Ace-inh or Norvasc.   She is here today for follow up. The patient denies any chest pain, dyspnea, palpitations, lower extremity edema, orthopnea, PND, dizziness, near syncope or syncope.    Primary Care Physician: Lujean Amel, MD  Past Medical History:  Diagnosis Date  . Allergy   . Bronchitis   . CAD (coronary artery disease)    a. Cath 2011: mild nonbstructive disease with 20% serial LAD stenosis, 30% diffuse apical LAD, 30% mid AV groove Cx, 40% mRCA (small vessel), LVEF 65%.   . Hyperlipidemia   . Hypertension   . Hypothyroidism   . Morbid obesity ([place]) 08/28/2017  . Obesity   . OSA (obstructive sleep apnea) 02/2017  . OSA on CPAP 08/28/2017   Severe obstructive sleep apnea with an AHI 44.5/h with oxygen saturations as low as 71%. Now on CPAP at 15 cm H2O.  . Pre-diabetes   . Seasonal allergies   . Sleep apnea    wears cpap  . Syncope    passes out when has no fresh air     Past Surgical History:  Procedure Laterality Date  . CARDIAC CATHETERIZATION    . COLONOSCOPY    . EYE SURGERY    . FOOT SURGERY    . herniated disk      Current Outpatient Medications  Medication Sig Dispense Refill  . aspirin 81 MG tablet Take 81 mg by mouth daily.      . carvedilol (COREG) 25 MG tablet TAKE 1 TABLET BY MOUTH TWICE A DAY 180 tablet 2  . chlorthalidone (HYGROTON) 25 MG tablet  TAKE 1 TABLET BY MOUTH EVERY DAY 90 tablet 2  . doxycycline (VIBRAMYCIN) 100 MG capsule Take 100 mg by mouth as needed (rosacea).   2  . DYMISTA 137-50 MCG/ACT SUSP Place 1 spray into both nostrils daily.  3  . fexofenadine (ALLEGRA) 180 MG tablet Take 180 mg by mouth daily.    . furosemide (LASIX) 40 MG tablet TAKE ONE TABLET BY MOUTH DAILY AS NEEDED FOR SWELLING 90 tablet 1  . levothyroxine (SYNTHROID, LEVOTHROID) 137 MCG tablet Take 137 mcg by mouth daily before breakfast.    . montelukast (SINGULAIR) 10 MG tablet Take 10 mg by mouth daily.  3  . potassium chloride (KLOR-CON) 20 MEQ packet Take 40 mEq by mouth 2 (two) times daily. PCP changed to 20 MEQ BID    . PROAIR HFA 108 (90 Base) MCG/ACT inhaler Inhale 1-2 puffs into the lungs every 4 (four) hours as needed. Wheezing or shortness of breath    . rosuvastatin (CRESTOR) 10 MG tablet TAKE 1 TABLET BY MOUTH EVERY DAY 90 tablet 1   No current facility-administered medications for this visit.    Allergies  Allergen Reactions  . Amoxicillin Swelling and Hives    REACTION: rash  . Ace Inhibitors Swelling    Angioedema in  2018  . Amlodipine     Edema at 5-10mg  doses  . Angiotensin Receptor Blockers     Angioedema in 2018 (irbesartan, olmesartan)  . Lisinopril Other (See Comments)    Edema   . Erythromycin Nausea Only, Other (See Comments) and Nausea And Vomiting    Stomach upset GI UPSET AND STOMACH BURNING GI UPSET AND STOMACH BURNING Stomach upset Stomach upset GI UPSET AND STOMACH BURNING GI UPSET AND STOMACH BURNING Stomach upset Stomach upset GI UPSET AND STOMACH BURNING GI UPSET AND STOMACH BURNING    Social History   Socioeconomic History  . Marital status: Single    Spouse name: Not on file  . Number of children: Not on file  . Years of education: Not on file  . Highest education level: Not on file  Occupational History  . Not on file  Tobacco Use  . Smoking status: Never Smoker  . Smokeless tobacco: Never  Used  Substance and Sexual Activity  . Alcohol use: Yes    Comment: rare  . Drug use: No  . Sexual activity: Never  Other Topics Concern  . Not on file  Social History Narrative  . Not on file   Social Determinants of Health   Financial Resource Strain:   . Difficulty of Paying Living Expenses:   Food Insecurity:   . Worried About Charity fundraiser in the Last Year:   . Arboriculturist in the Last Year:   Transportation Needs:   . Film/video editor (Medical):   Marland Kitchen Lack of Transportation (Non-Medical):   Physical Activity:   . Days of Exercise per Week:   . Minutes of Exercise per Session:   Stress:   . Feeling of Stress :   Social Connections:   . Frequency of Communication with Friends and Family:   . Frequency of Social Gatherings with Friends and Family:   . Attends Religious Services:   . Active Member of Clubs or Organizations:   . Attends Archivist Meetings:   Marland Kitchen Marital Status:   Intimate Partner Violence:   . Fear of Current or Ex-Partner:   . Emotionally Abused:   Marland Kitchen Physically Abused:   . Sexually Abused:     Family History  Problem Relation Age of Onset  . Heart attack Mother   . Hypertension Mother   . Heart attack Father   . Hypertension Father   . Heart attack Brother   . Hypertension Brother   . Colon cancer Neg Hx   . Colon polyps Neg Hx   . Esophageal cancer Neg Hx   . Rectal cancer Neg Hx   . Stomach cancer Neg Hx     Review of Systems:  As stated in the HPI and otherwise negative.   BP 128/80   Pulse 61   Ht 5\' 4"  (1.626 m)   Wt 219 lb 12.8 oz (99.7 kg)   LMP 07/29/2010   SpO2 96%   BMI 37.73 kg/m   Physical Examination:  General: Well developed, well nourished, NAD  HEENT: OP clear, mucus membranes moist  SKIN: warm, dry. No rashes. Neuro: No focal deficits  Musculoskeletal: Muscle strength 5/5 all ext  Psychiatric: Mood and affect normal  Neck: No JVD, no carotid bruits, no thyromegaly, no lymphadenopathy.   Lungs:Clear bilaterally, no wheezes, rhonci, crackles Cardiovascular: Regular rate and rhythm. No murmurs, gallops or rubs. Abdomen:Soft. Bowel sounds present. Non-tender.  Extremities: No lower extremity edema. Pulses are 2 + in the  bilateral DP/PT.  EKG:  EKG is ordered today. The ekg ordered today demonstrates   Recent Labs: No results found for requested labs within last 8760 hours.   Lipid Panel    Component Value Date/Time   CHOL 129 09/30/2014 1044   TRIG 52.0 09/30/2014 1044   HDL 48.10 09/30/2014 1044   CHOLHDL 3 09/30/2014 1044   VLDL 10.4 09/30/2014 1044   LDLCALC 70 09/30/2014 1044     Wt Readings from Last 3 Encounters:  06/30/19 219 lb 12.8 oz (99.7 kg)  12/04/18 229 lb 6.4 oz (104.1 kg)  10/18/17 229 lb 12.8 oz (104.2 kg)     Other studies Reviewed: Additional studies/ records that were reviewed today include: . Review of the above records demonstrates:    Assessment and Plan:   1. CAD without angina: She has mild CAD by cath in 2011. No chest pain. Continue ASA, statin and beta blocker.  Echo now to assess LV function.   2. HTN: BP is well controlled. Continue current therapy. (She has had issues with Norvasc, ARB and Ace-inh in the past).   3. HLD: Lipids followed in primary care. LDL 60 October 2020. Continue statin  4. Sleep apnea: She is on CPAP and followed by Dr. Radford Pax She does not wear it every night.   Current medicines are reviewed at length with the patient today.  The patient does not have concerns regarding medicines.  The following changes have been made:  no change  Labs/ tests ordered today include:   Orders Placed This Encounter  Procedures  . ECHOCARDIOGRAM COMPLETE    Disposition:   FU with me in 12  months  Signed, Lauree Chandler, MD 07/01/2019 12:37 PM    [facility] [place], [place], [place]  [address] Phone: [phone]; Fax: [phone]

## 2019-06-30 NOTE — Patient Instructions (Signed)
Medication Instructions:  No changes *If you need a refill on your cardiac medications before your next appointment, please call your pharmacy*   Lab Work: none If you have labs (blood work) drawn today and your tests are completely normal, you will receive your results only by: . MyChart Message (if you have MyChart) OR . A paper copy in the mail If you have any lab test that is abnormal or we need to change your treatment, we will call you to review the results.   Testing/Procedures: Your physician has requested that you have an echocardiogram. Echocardiography is a painless test that uses sound waves to create images of your heart. It provides your doctor with information about the size and shape of your heart and how well your heart's chambers and valves are working. This procedure takes approximately one hour. There are no restrictions for this procedure.   Follow-Up: At [facility], you and your health needs are our priority.  As part of our continuing mission to provide you with exceptional heart care, we have created designated Provider Care Teams.  These Care Teams include your primary Cardiologist (physician) and Advanced Practice Providers (APPs -  Physician Assistants and Nurse Practitioners) who all work together to provide you with the care you need, when you need it.   Your next appointment:   12 month(s)  The format for your next appointment:   In Person  Provider:   You may see Christopher McAlhany, MD or one of the following Advanced Practice Providers on your designated Care Team:    Dayna Dunn, PA-C  Michele Lenze, PA-C   Other Instructions   

## 2019-07-09 IMAGING — DX DG CHEST 2V
2 series · 2 of 2 positions shown · non-contrast
Comparison: No recent.

CLINICAL DATA: Dry cough.  Wheezing

EXAM:
CHEST - 2 VIEW

[dg chest 2 view (1 of 2)]
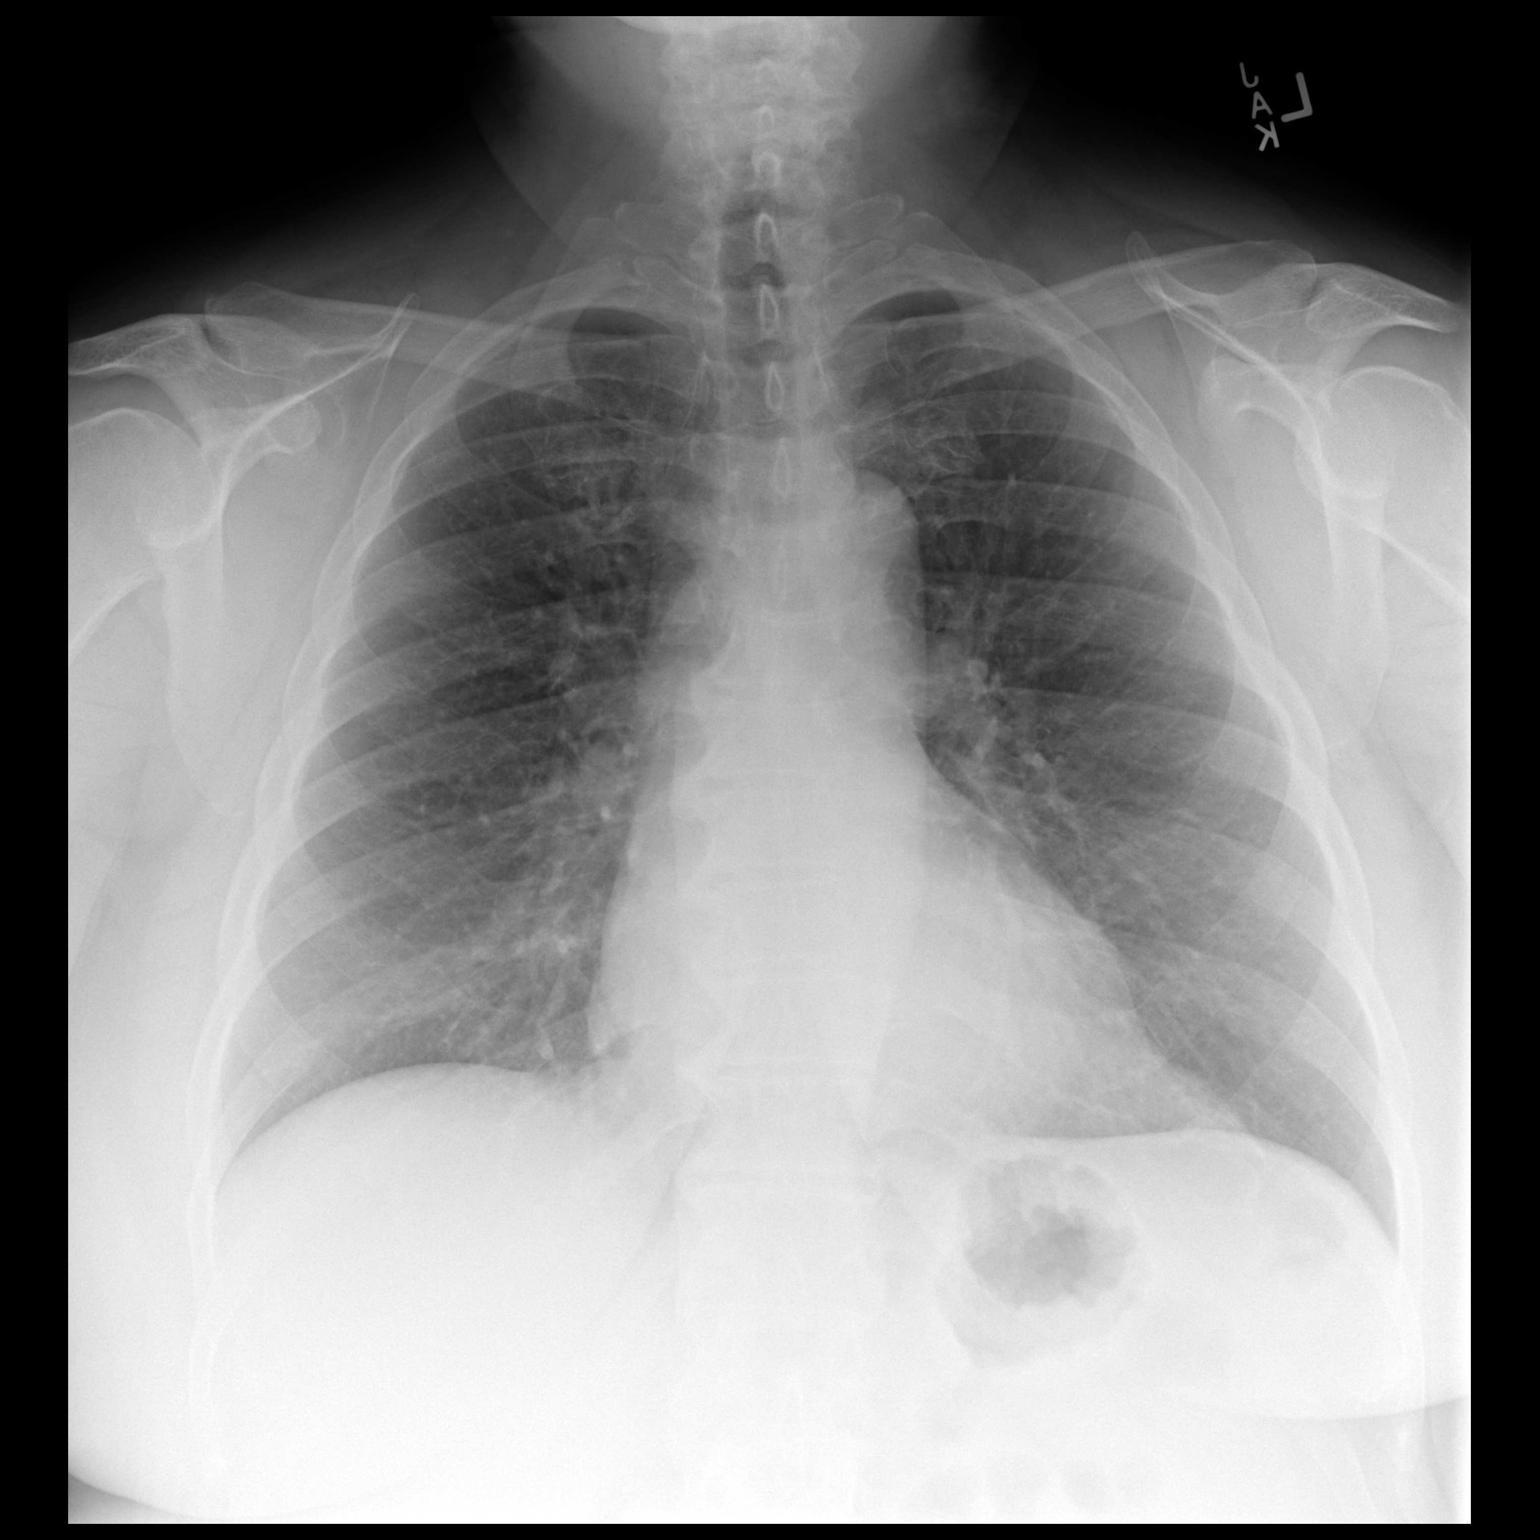

[dg chest 2 view (2 of 2)]
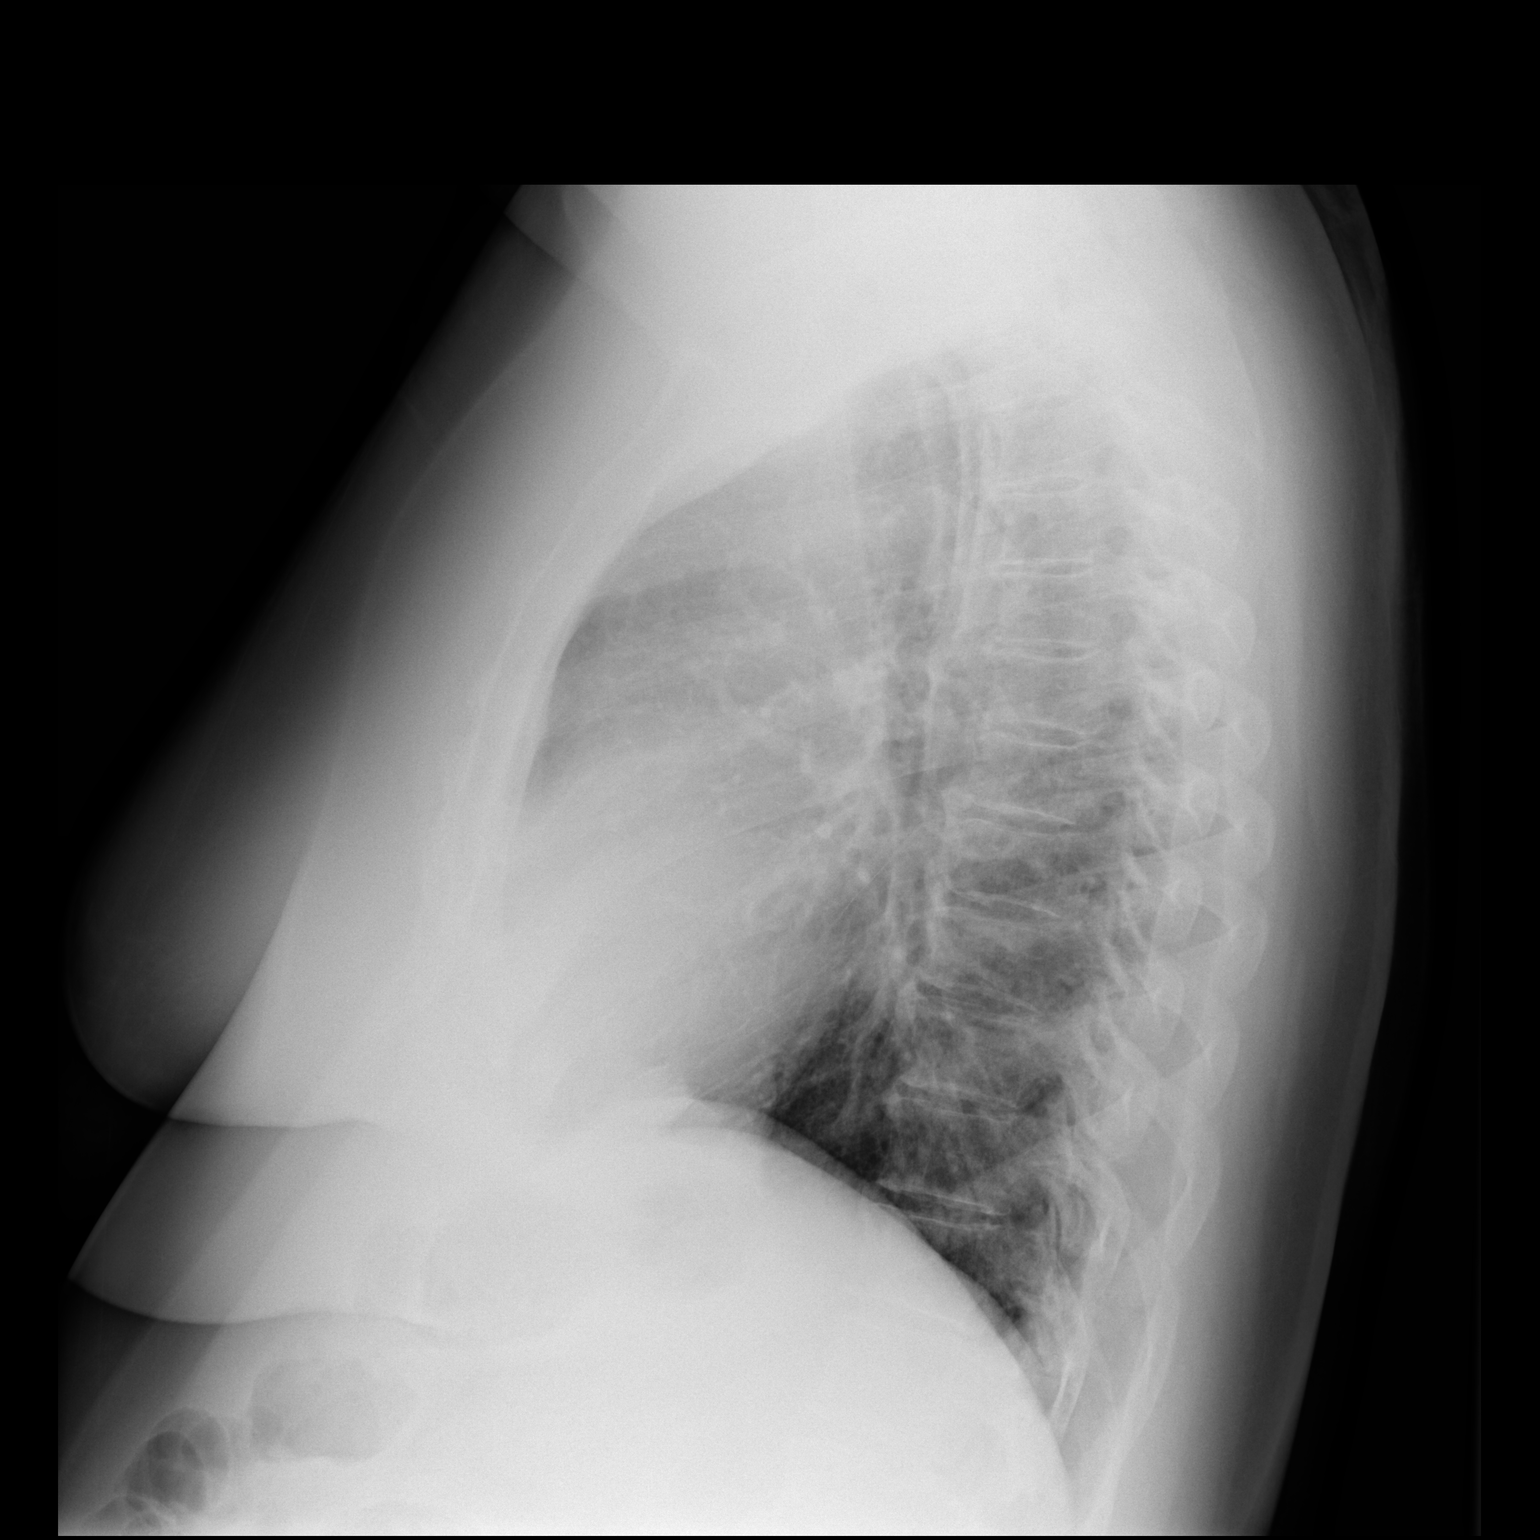

[2 of 2 positions shown; findings below may reference images not displayed]

FINDINGS: Mediastinum

Hilar structures normal. Lungs are clear. No pleural effusion or
pneumothorax. Heart size normal. No acute bony abnormality.
Degenerative changes thoracic spine.
IMPRESSION: No acute cardiopulmonary disease.

## 2019-07-29 ENCOUNTER — Ambulatory Visit (HOSPITAL_COMMUNITY): Payer: BC Managed Care – PPO | Attending: Cardiovascular Disease

## 2019-07-29 ENCOUNTER — Other Ambulatory Visit: Payer: Self-pay

## 2019-07-29 DIAGNOSIS — I2510 Atherosclerotic heart disease of native coronary artery without angina pectoris: Secondary | ICD-10-CM | POA: Insufficient documentation

## 2019-07-29 MED ORDER — PERFLUTREN LIPID MICROSPHERE
1.0000 mL | INTRAVENOUS | Status: AC | PRN
  Administered 2019-07-29: 3 mL via INTRAVENOUS

## 2019-10-22 ENCOUNTER — Encounter: Payer: BC Managed Care – PPO | Admitting: Obstetrics & Gynecology

## 2019-11-29 ENCOUNTER — Ambulatory Visit: Payer: BC Managed Care – PPO | Attending: Internal Medicine

## 2019-11-29 DIAGNOSIS — Z23 Encounter for immunization: Secondary | ICD-10-CM

## 2019-11-29 NOTE — Progress Notes (Signed)
   Covid-19 Vaccination Clinic  Name:  JACEY ECKERSON    MRN: [id number] DOB: [birth year]  11/29/2019  Ms. Mcilrath was observed post Covid-19 immunization for 15 minutes without incident. She was provided with Vaccine Information Sheet and instruction to access the V-Safe system.   Ms. Sakata was instructed to call 911 with any severe reactions post vaccine: Marland Kitchen Difficulty breathing  . Swelling of face and throat  . A fast heartbeat  . A bad rash all over body  . Dizziness and weakness

## 2019-12-26 ENCOUNTER — Encounter: Payer: BC Managed Care – PPO | Admitting: Obstetrics & Gynecology

## 2020-01-14 ENCOUNTER — Encounter: Payer: Self-pay | Admitting: Obstetrics & Gynecology

## 2020-02-24 ENCOUNTER — Ambulatory Visit (INDEPENDENT_AMBULATORY_CARE_PROVIDER_SITE_OTHER): Payer: BC Managed Care – PPO | Admitting: Obstetrics & Gynecology

## 2020-02-24 ENCOUNTER — Other Ambulatory Visit: Payer: Self-pay

## 2020-02-24 ENCOUNTER — Encounter: Payer: Self-pay | Admitting: Obstetrics & Gynecology

## 2020-02-24 VITALS — BP 126/80 | Ht 63.5 in | Wt 211.0 lb

## 2020-02-24 DIAGNOSIS — Z780 Asymptomatic menopausal state: Secondary | ICD-10-CM | POA: Diagnosis not present

## 2020-02-24 DIAGNOSIS — B372 Candidiasis of skin and nail: Secondary | ICD-10-CM | POA: Diagnosis not present

## 2020-02-24 DIAGNOSIS — Z01419 Encounter for gynecological examination (general) (routine) without abnormal findings: Secondary | ICD-10-CM

## 2020-02-24 DIAGNOSIS — Z6836 Body mass index (BMI) 36.0-36.9, adult: Secondary | ICD-10-CM

## 2020-02-24 MED ORDER — NYSTATIN 100000 UNIT/GM EX POWD: 1.0000 "application " | Freq: Two times a day (BID) | CUTANEOUS | 3 refills | Status: DC

## 2020-02-24 NOTE — Progress Notes (Signed)
Gina Lara 11-Jul-1955 [id number]   History:    65 y.o. G0 Single.  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  RP:  Established patient presenting for annual gyn exam   HPI: Postmenopause, well on no HRT.  No PBM.  No pelvic pain.  H/O Lichen Sclerosus, but well without Clobetasol applications x >2 years. Urine and bowel movements normal.  Breasts normal. Body mass index 36.79.  Lost weight on a plant based nutrition.  Health labs with family physician.    Past medical history,surgical history, family history and social history were all reviewed and documented in the EPIC chart.  Gynecologic History Patient's last menstrual period was 07/29/2010.  Obstetric History OB History  Gravida Para Term Preterm AB Living  0 0 0 0 0 0  SAB IAB Ectopic Multiple Live Births  0 0 0 0 0     ROS: A ROS was performed and pertinent positives and negatives are included in the history.  GENERAL: No fevers or chills. HEENT: No change in vision, no earache, sore throat or sinus congestion. NECK: No pain or stiffness. CARDIOVASCULAR: No chest pain or pressure. No palpitations. PULMONARY: No shortness of breath, cough or wheeze. GASTROINTESTINAL: No abdominal pain, nausea, vomiting or diarrhea, melena or bright red blood per rectum. GENITOURINARY: No urinary frequency, urgency, hesitancy or dysuria. MUSCULOSKELETAL: No joint or muscle pain, no back pain, no recent trauma. DERMATOLOGIC: No rash, no itching, no lesions. ENDOCRINE: No polyuria, polydipsia, no heat or cold intolerance. No recent change in weight. HEMATOLOGICAL: No anemia or easy bruising or bleeding. NEUROLOGIC: No headache, seizures, numbness, tingling or weakness. PSYCHIATRIC: No depression, no loss of interest in normal activity or change in sleep pattern.     Exam:   BP 126/80   Ht 5' 3.5" (1.613 m)   Wt 211 lb (95.7 kg)   LMP 07/29/2010   BMI 36.79 kg/m   Body mass index is 36.79 kg/m.  General appearance : Well developed well  nourished female. No acute distress HEENT: Eyes: no retinal hemorrhage or exudates,  Neck supple, trachea midline, no carotid bruits, no thyroidmegaly Lungs: Clear to auscultation, no rhonchi or wheezes, or rib retractions  Heart: Regular rate and rhythm, no murmurs or gallops Breast:Examined in sitting and supine position were symmetrical in appearance, no palpable masses or tenderness,  no skin retraction, no nipple inversion, no nipple discharge, no skin discoloration, no axillary or supraclavicular lymphadenopathy Abdomen: no palpable masses or tenderness, no rebound or guarding Extremities: no edema or skin discoloration or tenderness  Pelvic: Vulva: Normal             Vagina: No gross lesions or discharge  Cervix: No gross lesions or discharge  Uterus  AV, normal size, shape and consistency, non-tender and mobile  Adnexa  Without masses or tenderness  Anus: Normal   Assessment/Plan:  65 y.o. female for annual exam   1. Encounter for routine gynecological examination with Papanicolaou smear of cervix Normal gynecologic exam in menopause.  No indication to repeat a Pap test this year.  Breast exam normal.  Screening mammogram October 2021 was negative.  Colonoscopy 2019.  Health labs with family physician.  2. Postmenopause Well on no hormone replacement therapy.  No postmenopausal bleeding.  3. Yeast infection of the skin Yeast infection of the skin under the breasts and at the inguinal folds.  Will treat with nystatin powder.  Usage reviewed and prescription sent to pharmacy.  4. Class 2 severe obesity due to excess  calories with serious comorbidity and body mass index (BMI) of 36.0 to 36.9 in adult ([place]) Good weight loss since last year on plant-based nutrition.  Recommend increasing aerobic activities to 5 times a week and light weightlifting every 2 days.  Other orders - nystatin (MYCOSTATIN/NYSTOP) powder; Apply 1 application topically 2 (two) times daily.  Princess Bruins MD, 4:09 PM 02/24/2020

## 2020-02-25 LAB — PAP IG W/ RFLX HPV ASCU

## 2020-11-06 ENCOUNTER — Other Ambulatory Visit: Payer: Self-pay | Admitting: Obstetrics & Gynecology

## 2020-11-08 NOTE — Telephone Encounter (Signed)
Annual exam was in Jan [address], per note "H/O Lichen Sclerosus, but well without Clobetasol applications x >2 year"

## 2020-12-28 ENCOUNTER — Telehealth: Payer: Self-pay | Admitting: Cardiovascular Disease

## 2020-12-28 NOTE — Telephone Encounter (Signed)
   Per patient schedule MyChart message:  Pt c/o BP issue: STAT if pt c/o blurred vision, one-sided weakness or slurred speech  1. What are your last 5 BP readings?   Nov 14 140/76 Nov 11 139/71 Nov 9  142/80 Nov 8. 153/84 Nov 7. 155/77  2. Are you having any other symptoms (ex. Dizziness, headache, blurred vision, passed out)?   I have not been having any symptoms other than some left arm pain and a little squeezing in my chest, both which are temporary and occur during high stress times.   3. What is your BP issue?    My concern is that my bp is hanging out in the upper 140's most of the time and higher during high stress.  I was on Lisinopril for years and it kept my bp around 117/70, but then I developed an allergic reaction so it had to be changed.   With my family history, I don't take this lightly and am concerned about the long term affects.

## 2020-12-29 NOTE — Telephone Encounter (Signed)
See mychart encounter for details.  Communicated through [place] w patient.  She has been scheduled on 01/19/21 as a work in for Dr. Angelena Form.  I have offered sooner with an APP. She prefers to see this provider.   I will keep her on my wait/work in list for sooner afternoon appointment.

## 2021-01-19 ENCOUNTER — Other Ambulatory Visit: Payer: Self-pay

## 2021-01-19 ENCOUNTER — Ambulatory Visit: Payer: BC Managed Care – PPO | Admitting: Cardiovascular Disease

## 2021-01-19 ENCOUNTER — Encounter: Payer: Self-pay | Admitting: Cardiovascular Disease

## 2021-01-19 VITALS — BP 112/72 | HR 51 | Ht 63.75 in | Wt 205.6 lb

## 2021-01-19 DIAGNOSIS — I2510 Atherosclerotic heart disease of native coronary artery without angina pectoris: Secondary | ICD-10-CM

## 2021-01-19 DIAGNOSIS — I10 Essential (primary) hypertension: Secondary | ICD-10-CM

## 2021-01-19 DIAGNOSIS — E785 Hyperlipidemia, unspecified: Secondary | ICD-10-CM

## 2021-01-19 MED ORDER — HYDRALAZINE HCL 25 MG PO TABS: 25.0000 mg | ORAL_TABLET | Freq: Three times a day (TID) | ORAL | 3 refills | Status: DC

## 2021-01-19 NOTE — Progress Notes (Signed)
Chief Complaint  Patient presents with   Follow-up    HTN    History of Present Illness: 65 yo female with history of CAD, HTN, sleep apnea, morbid obesity and hypothyroidism who is here today for follow up. She was seen as a new patient on 03/30/09. At the first visit, she complained of left sided and substernal chest pressure. There was associated nausea and radiation of the pain into the left arm. Because of her strong family history, risk factors and story, I arranged a diagnostic left heart cath on 03/31/09. This showed normal LV function with mild non-obstructive CAD. She did not tolerate ARBs, Ace-inh or Norvasc.   She is here today for follow up. The patient denies any chest pain, dyspnea, palpitations, lower extremity edema, orthopnea, PND, dizziness, near syncope or syncope.     Primary Care Physician: Lujean Amel, MD  Past Medical History:  Diagnosis Date   Allergy    Bronchitis    CAD (coronary artery disease)    a. Cath 2011: mild nonbstructive disease with 20% serial LAD stenosis, 30% diffuse apical LAD, 30% mid AV groove Cx, 40% mRCA (small vessel), LVEF 65%.    Hyperlipidemia    Hypertension    Hypothyroidism    Morbid obesity ([place]) 08/28/2017   Obesity    OSA (obstructive sleep apnea) 02/2017   OSA on CPAP 08/28/2017   Severe obstructive sleep apnea with an AHI 44.5/h with oxygen saturations as low as 71%. Now on CPAP at 15 cm H2O.   Pre-diabetes    Seasonal allergies    Sleep apnea    wears cpap   Syncope    passes out when has no fresh air     Past Surgical History:  Procedure Laterality Date   CARDIAC CATHETERIZATION     CARPAL TUNNEL RELEASE Bilateral    COLONOSCOPY     EYE SURGERY     FOOT SURGERY     herniated disk     TRIGGER FINGER RELEASE  2020   THUMB ON RIGHT AND LEFT HAND    Current Outpatient Medications  Medication Sig Dispense Refill   aspirin 81 MG tablet Take 81 mg by mouth daily.     carvedilol (COREG) 25 MG tablet TAKE 1 TABLET  BY MOUTH TWICE A DAY 180 tablet 2   chlorthalidone (HYGROTON) 25 MG tablet TAKE 1 TABLET BY MOUTH EVERY DAY 90 tablet 2   DYMISTA 137-50 MCG/ACT SUSP Place 1 spray into both nostrils daily.  3   fexofenadine (ALLEGRA) 180 MG tablet Take 180 mg by mouth daily.     furosemide (LASIX) 40 MG tablet TAKE ONE TABLET BY MOUTH DAILY AS NEEDED FOR SWELLING 90 tablet 1   hydrALAZINE (APRESOLINE) 25 MG tablet Take 1 tablet (25 mg total) by mouth 3 (three) times daily. 270 tablet 3   levothyroxine (SYNTHROID, LEVOTHROID) 137 MCG tablet Take 137 mcg by mouth daily before breakfast.     montelukast (SINGULAIR) 10 MG tablet Take 10 mg by mouth daily.  3   nystatin (MYCOSTATIN/NYSTOP) powder APPLY TO AFFECTED AREA TWICE A DAY 15 g 3   potassium chloride (KLOR-CON) 20 MEQ packet Take 40 mEq by mouth 2 (two) times daily. PCP changed to 20 MEQ BID     rosuvastatin (CRESTOR) 10 MG tablet TAKE 1 TABLET BY MOUTH EVERY DAY 90 tablet 1   PROAIR HFA 108 (90 Base) MCG/ACT inhaler Inhale 1-2 puffs into the lungs every 4 (four) hours as needed. Wheezing or shortness  of breath (Patient not taking: Reported on 01/19/2021)     No current facility-administered medications for this visit.    Allergies  Allergen Reactions   Amoxicillin Swelling and Hives    REACTION: rash   Ace Inhibitors Swelling    Angioedema in 2018   Amlodipine     Edema at 5-10mg  doses   Angiotensin Receptor Blockers     Angioedema in 2018 (irbesartan, olmesartan)   Lisinopril Other (See Comments)    Edema    Erythromycin Nausea And Vomiting, Nausea Only and Other (See Comments)    Stomach upset GI UPSET AND STOMACH BURNING     Social History   Socioeconomic History   Marital status: Single    Spouse name: Not on file   Number of children: Not on file   Years of education: Not on file   Highest education level: Not on file  Occupational History   Not on file  Tobacco Use   Smoking status: Never   Smokeless tobacco: Never  Vaping  Use   Vaping Use: Never used  Substance and Sexual Activity   Alcohol use: Yes    Comment: rare   Drug use: No   Sexual activity: Never  Other Topics Concern   Not on file  Social History Narrative   Not on file   Social Determinants of Health   Financial Resource Strain: Not on file  Food Insecurity: Not on file  Transportation Needs: Not on file  Physical Activity: Not on file  Stress: Not on file  Social Connections: Not on file  Intimate Partner Violence: Not on file    Family History  Problem Relation Age of Onset   Heart attack Mother    Hypertension Mother    Heart attack Father    Hypertension Father    Heart attack Brother    Hypertension Brother    Colon cancer Neg Hx    Colon polyps Neg Hx    Esophageal cancer Neg Hx    Rectal cancer Neg Hx    Stomach cancer Neg Hx     Review of Systems:  As stated in the HPI and otherwise negative.   BP 112/72   Pulse (!) 51   Ht 5' 3.75" (1.619 m)   Wt 205 lb 9.6 oz (93.3 kg)   LMP 07/29/2010   SpO2 98%   BMI 35.57 kg/m   Physical Examination: General: Well developed, well nourished, NAD  HEENT: OP clear, mucus membranes moist  SKIN: warm, dry. No rashes. Neuro: No focal deficits  Musculoskeletal: Muscle strength 5/5 all ext  Psychiatric: Mood and affect normal  Neck: No JVD, no carotid bruits, no thyromegaly, no lymphadenopathy.  Lungs:Clear bilaterally, no wheezes, rhonci, crackles Cardiovascular: Regular rate and rhythm. No murmurs, gallops or rubs. Abdomen:Soft. Bowel sounds present. Non-tender.  Extremities: No lower extremity edema. Pulses are 2 + in the bilateral DP/PT.  EKG:  EKG is ordered today. The ekg ordered today demonstrates Sinus bradycardia, rate 51 bpm  Echo June 2021:   1. Left ventricular ejection fraction, by estimation, is 55 to 60%. The  left ventricle has normal function. The left ventricle has no regional  wall motion abnormalities. There is mild concentric left ventricular   hypertrophy. Left ventricular diastolic  parameters are consistent with Grade I diastolic dysfunction (impaired  relaxation).   2. Right ventricular systolic function is normal. The right ventricular  size is normal. Tricuspid regurgitation signal is inadequate for assessing  PA pressure.   3.  The mitral valve is grossly normal. Trivial mitral valve  regurgitation. No evidence of mitral stenosis.   4. The aortic valve is tricuspid. Aortic valve regurgitation is not  visualized. Mild to moderate aortic valve sclerosis/calcification is  present, without any evidence of aortic stenosis.   5. The inferior vena cava is normal in size with greater than 50%  respiratory variability, suggesting right atrial pressure of 3 mmHg.   Recent Labs: No results found for requested labs within last 8760 hours.   Lipid Panel    Component Value Date/Time   CHOL 129 09/30/2014 1044   TRIG 52.0 09/30/2014 1044   HDL 48.10 09/30/2014 1044   CHOLHDL 3 09/30/2014 1044   VLDL 10.4 09/30/2014 1044   LDLCALC 70 09/30/2014 1044     Wt Readings from Last 3 Encounters:  01/19/21 205 lb 9.6 oz (93.3 kg)  02/24/20 211 lb (95.7 kg)  06/30/19 219 lb 12.8 oz (99.7 kg)     Other studies Reviewed: Additional studies/ records that were reviewed today include: . Review of the above records demonstrates:    Assessment and Plan:   1. CAD without angina: She has mild CAD by cath in 2011. She has no chest pain. Echo June 2021  with normal LV function. Will continue ASA, statin and beta blocker.    2. HTN: BP is elevated in the 140s-150s at home. She has had issues with Norvasc, ARB and Ace-inh in the past. Continue Coreg 25 mg BID and Chlorthalidone 25 mg po once daily. Will add hydralazine 25 mg po TID  3. HLD: Lipids followed in primary care. LDL 46 in May 2022. Continue statin  4. Sleep apnea: She is on CPAP and followed by Dr. Radford Pax. She does not wear it every night.   Current medicines are reviewed at  length with the patient today.  The patient does not have concerns regarding medicines.  The following changes have been made:  no change  Labs/ tests ordered today include:   Orders Placed This Encounter  Procedures   EKG 12-Lead     Disposition:   FU with me in 12  months  Signed, Lauree Chandler, MD 01/19/2021 [facility] [place], [place], [place]  [address] Phone: [phone]; Fax: [phone]

## 2021-01-19 NOTE — Patient Instructions (Signed)
Medication Instructions:  Your physician has recommended you make the following change in your medication:  1.) start hydralazine 25 mg - take one tablet three times a day  *If you need a refill on your cardiac medications before your next appointment, please call your pharmacy*   Lab Work: none   Testing/Procedures: none   Follow-Up: At [facility], you and your health needs are our priority.  As part of our continuing mission to provide you with exceptional heart care, we have created designated Provider Care Teams.  These Care Teams include your primary Cardiologist (physician) and Advanced Practice Providers (APPs -  Physician Assistants and Nurse Practitioners) who all work together to provide you with the care you need, when you need it.   Your next appointment:   12 month(s)  The format for your next appointment:   In Person  Provider:   Lauree Chandler, MD     Other Instructions

## 2021-03-04 ENCOUNTER — Encounter: Payer: Self-pay | Admitting: Obstetrics & Gynecology

## 2021-09-26 ENCOUNTER — Ambulatory Visit: Payer: BC Managed Care – PPO | Admitting: Obstetrics & Gynecology

## 2022-03-20 ENCOUNTER — Encounter: Payer: Self-pay | Admitting: Obstetrics & Gynecology

## 2022-08-31 DIAGNOSIS — I2510 Atherosclerotic heart disease of native coronary artery without angina pectoris: Secondary | ICD-10-CM

## 2022-11-20 ENCOUNTER — Ambulatory Visit: Payer: BC Managed Care – PPO | Attending: Cardiovascular Disease | Admitting: Cardiovascular Disease

## 2022-11-20 ENCOUNTER — Encounter: Payer: Self-pay | Admitting: Cardiovascular Disease

## 2022-11-20 VITALS — BP 116/70 | HR 55 | Ht 63.75 in | Wt 198.8 lb

## 2022-11-20 DIAGNOSIS — I2510 Atherosclerotic heart disease of native coronary artery without angina pectoris: Secondary | ICD-10-CM | POA: Diagnosis not present

## 2022-11-20 DIAGNOSIS — E785 Hyperlipidemia, unspecified: Secondary | ICD-10-CM

## 2022-11-20 DIAGNOSIS — I10 Essential (primary) hypertension: Secondary | ICD-10-CM | POA: Diagnosis not present

## 2022-11-20 NOTE — Progress Notes (Signed)
Chief Complaint  Patient presents with   Follow-up    CAD   History of Present Illness: 67 yo female with history of CAD, HTN, sleep apnea, morbid obesity and hypothyroidism who is here today for follow up. She was seen as a new patient in 2011. At the first visit, she complained of left sided and substernal chest pressure. There was associated nausea and radiation of the pain into the left arm. Because of her strong family history, risk factors and story, I arranged a diagnostic left heart cath on 03/31/09. This showed normal LV function with mild non-obstructive CAD. She did not tolerate ARBs, Ace-inh or Norvasc. Hydralazine started at last visit but she did not tolerate this.   She is here today for follow up. The patient denies any chest pain, dyspnea, palpitations, lower extremity edema, orthopnea, PND, dizziness, near syncope or syncope.    Primary Care Physician: Darrow Bussing, MD  Past Medical History:  Diagnosis Date   Allergy    Bronchitis    CAD (coronary artery disease)    a. Cath 2011: mild nonbstructive disease with 20% serial LAD stenosis, 30% diffuse apical LAD, 30% mid AV groove Cx, 40% mRCA (small vessel), LVEF 65%.    Hyperlipidemia    Hypertension    Hypothyroidism    Morbid obesity (HCC) 08/28/2017   Obesity    OSA (obstructive sleep apnea) 02/2017   OSA on CPAP 08/28/2017   Severe obstructive sleep apnea with an AHI 44.5/h with oxygen saturations as low as 71%. Now on CPAP at 15 cm H2O.   Pre-diabetes    Seasonal allergies    Sleep apnea    wears cpap   Syncope    passes out when has no fresh air     Past Surgical History:  Procedure Laterality Date   CARDIAC CATHETERIZATION     CARPAL TUNNEL RELEASE Bilateral    COLONOSCOPY     EYE SURGERY     FOOT SURGERY     herniated disk     TRIGGER FINGER RELEASE  2020   THUMB ON RIGHT AND LEFT HAND    Current Outpatient Medications  Medication Sig Dispense Refill   aspirin 81 MG tablet Take 81 mg by mouth  daily.     carvedilol (COREG) 25 MG tablet TAKE 1 TABLET BY MOUTH TWICE A DAY 180 tablet 2   chlorthalidone (HYGROTON) 25 MG tablet TAKE 1 TABLET BY MOUTH EVERY DAY 90 tablet 2   DYMISTA 137-50 MCG/ACT SUSP Place 1 spray into both nostrils daily.  3   fexofenadine (ALLEGRA) 180 MG tablet Take 180 mg by mouth daily.     furosemide (LASIX) 40 MG tablet TAKE ONE TABLET BY MOUTH DAILY AS NEEDED FOR SWELLING 90 tablet 1   levothyroxine (SYNTHROID, LEVOTHROID) 137 MCG tablet Take 137 mcg by mouth daily before breakfast.     montelukast (SINGULAIR) 10 MG tablet Take 10 mg by mouth daily.  3   nystatin (MYCOSTATIN/NYSTOP) powder APPLY TO AFFECTED AREA TWICE A DAY 15 g 3   potassium chloride (KLOR-CON) 20 MEQ packet Take 40 mEq by mouth 2 (two) times daily. PCP changed to 20 MEQ BID     PROAIR HFA 108 (90 Base) MCG/ACT inhaler Inhale 1-2 puffs into the lungs every 4 (four) hours as needed. Wheezing or shortness of breath     rosuvastatin (CRESTOR) 10 MG tablet TAKE 1 TABLET BY MOUTH EVERY DAY 90 tablet 1   No current facility-administered medications for this visit.  Allergies  Allergen Reactions   Amoxicillin Swelling and Hives    REACTION: rash   Ace Inhibitors Swelling    Angioedema in 2018   Amlodipine     Edema at 5-10mg  doses   Angiotensin Receptor Blockers     Angioedema in 2018 (irbesartan, olmesartan)   Lisinopril Other (See Comments)    Edema    Erythromycin Nausea And Vomiting, Nausea Only and Other (See Comments)    Stomach upset GI UPSET AND STOMACH BURNING     Social History   Socioeconomic History   Marital status: Single    Spouse name: Not on file   Number of children: Not on file   Years of education: Not on file   Highest education level: Not on file  Occupational History   Not on file  Tobacco Use   Smoking status: Never   Smokeless tobacco: Never  Vaping Use   Vaping status: Never Used  Substance and Sexual Activity   Alcohol use: Yes    Comment: rare    Drug use: No   Sexual activity: Never  Other Topics Concern   Not on file  Social History Narrative   Not on file   Social Determinants of Health   Financial Resource Strain: Not on file  Food Insecurity: Not on file  Transportation Needs: Not on file  Physical Activity: Not on file  Stress: Not on file  Social Connections: Not on file  Intimate Partner Violence: Not on file    Family History  Problem Relation Age of Onset   Heart attack Mother    Hypertension Mother    Heart attack Father    Hypertension Father    Heart attack Brother    Hypertension Brother    Colon cancer Neg Hx    Colon polyps Neg Hx    Esophageal cancer Neg Hx    Rectal cancer Neg Hx    Stomach cancer Neg Hx     Review of Systems:  As stated in the HPI and otherwise negative.   BP 116/70   Pulse (!) 55   Ht 5' 3.75" (1.619 m)   Wt 90.2 kg   LMP 07/29/2010   SpO2 95%   BMI 34.39 kg/m   Physical Examination:  General: Well developed, well nourished, NAD  HEENT: OP clear, mucus membranes moist  SKIN: warm, dry. No rashes. Neuro: No focal deficits  Musculoskeletal: Muscle strength 5/5 all ext  Psychiatric: Mood and affect normal  Neck: No JVD, no carotid bruits, no thyromegaly, no lymphadenopathy.  Lungs:Clear bilaterally, no wheezes, rhonci, crackles Cardiovascular: Regular rate and rhythm. No murmurs, gallops or rubs. Abdomen:Soft. Bowel sounds present. Non-tender.  Extremities: No lower extremity edema. Pulses are 2 + in the bilateral DP/PT.  EKG:  EKG is ordered today. The ekg ordered today demonstrates EKG Interpretation Date/Time:  Thursday August 31 2022 16:28:17 EDT Ventricular Rate:  58 PR Interval:  204 QRS Duration:  78 QT Interval:  442 QTC Calculation: 433 R Axis:   19  Text Interpretation: Sinus bradycardia No previous ECGs available Confirmed by Verne Carrow [phone]) on 11/20/2022 2:44:00 PM    Echo June 2021:   1. Left ventricular ejection fraction, by  estimation, is 55 to 60%. The  left ventricle has normal function. The left ventricle has no regional  wall motion abnormalities. There is mild concentric left ventricular  hypertrophy. Left ventricular diastolic  parameters are consistent with Grade I diastolic dysfunction (impaired  relaxation).   2. Right ventricular  systolic function is normal. The right ventricular  size is normal. Tricuspid regurgitation signal is inadequate for assessing  PA pressure.   3. The mitral valve is grossly normal. Trivial mitral valve  regurgitation. No evidence of mitral stenosis.   4. The aortic valve is tricuspid. Aortic valve regurgitation is not  visualized. Mild to moderate aortic valve sclerosis/calcification is  present, without any evidence of aortic stenosis.   5. The inferior vena cava is normal in size with greater than 50%  respiratory variability, suggesting right atrial pressure of 3 mmHg.   Recent Labs: No results found for requested labs within last 365 days.   Lipid Panel    Component Value Date/Time   CHOL 129 09/30/2014 1044   TRIG 52.0 09/30/2014 1044   HDL 48.10 09/30/2014 1044   CHOLHDL 3 09/30/2014 1044   VLDL 10.4 09/30/2014 1044   LDLCALC 70 09/30/2014 1044     Wt Readings from Last 3 Encounters:  11/20/22 90.2 kg  01/19/21 93.3 kg  02/24/20 95.7 kg    Assessment and Plan:   1. CAD without angina: She has mild CAD by cath in 2011. No chest pain. Echo June 2021 with normal LV function. Continue ASA, beta blocker and statin.     2. HTN: BP is controlled. She has had issues with Hydralazine, Norvasc, ARB and Ace-inh in the past. Will continue Coreg, Chlorthalidone.   3. HLD: Lipids followed in primary care. LDL near goal in July 2023. Continue statin. Repeat labs in primary care  4. Sleep apnea: She has CPAP but has not worn it in years.    Labs/ tests ordered today include:  Orders Placed This Encounter  Procedures   EKG 12-Lead   EKG 12-Lead   Disposition:    F/U with me in 12  months  Signed, Verne Carrow, MD 11/20/2022 3:13 PM    [facility] [address] [place], [place], [place]  [address] Phone: [phone]; Fax: [phone]

## 2022-11-20 NOTE — Patient Instructions (Signed)

## 2023-04-27 ENCOUNTER — Ambulatory Visit: Payer: BC Managed Care – PPO | Admitting: Cardiovascular Disease

## 2023-11-13 DIAGNOSIS — L578 Other skin changes due to chronic exposure to nonionizing radiation: Secondary | ICD-10-CM | POA: Diagnosis not present

## 2023-11-13 DIAGNOSIS — D229 Melanocytic nevi, unspecified: Secondary | ICD-10-CM | POA: Diagnosis not present

## 2023-11-13 DIAGNOSIS — L814 Other melanin hyperpigmentation: Secondary | ICD-10-CM | POA: Diagnosis not present

## 2023-11-13 DIAGNOSIS — L304 Erythema intertrigo: Secondary | ICD-10-CM | POA: Diagnosis not present

## 2023-11-13 DIAGNOSIS — L821 Other seborrheic keratosis: Secondary | ICD-10-CM | POA: Diagnosis not present

## 2023-11-13 DIAGNOSIS — D1801 Hemangioma of skin and subcutaneous tissue: Secondary | ICD-10-CM | POA: Diagnosis not present

## 2023-11-13 DIAGNOSIS — L810 Postinflammatory hyperpigmentation: Secondary | ICD-10-CM | POA: Diagnosis not present

## 2023-11-27 DIAGNOSIS — Z23 Encounter for immunization: Secondary | ICD-10-CM | POA: Diagnosis not present

## 2023-12-13 DIAGNOSIS — Z23 Encounter for immunization: Secondary | ICD-10-CM | POA: Diagnosis not present

## 2024-02-19 ENCOUNTER — Telehealth: Payer: Self-pay | Admitting: Cardiovascular Disease

## 2024-02-19 NOTE — Telephone Encounter (Signed)
 STAT if HR is under 50 or over 120 (normal HR is 60-100 beats per minute)  What is your heart rate?   64  Do you have a log of your heart rate readings (document readings)?   No  Do you have any other symptoms?   No   Patient stated last night she got 6 notices on her Apple Watch that her HR had fallen below 38.

## 2024-02-19 NOTE — Telephone Encounter (Signed)
 Patient identification verified by 2 forms.   Called and spoke to patient  Patient states:  -Received notification HR was 37-38 bpm -Normally 2-3 episodes, not every night.  -Has had a sleep tests in the past. -Pt willing to do another sleep test, not at a facility.  -Usually right before she gets up.  -Normal HR is mid 50s  Patient denies:  -Noticing any symptoms at the time             Interventions/Plan: -Message to Dr. Verlin and scheduling staff  Patient agrees with plan, no questions at this time

## 2024-03-03 ENCOUNTER — Encounter: Payer: Self-pay | Admitting: Cardiovascular Disease

## 2024-03-03 NOTE — Telephone Encounter (Signed)
 STAT if HR is under 50 or over 120 (normal HR is 60-100 beats per minute)  What is your heart rate?   61 bpm today (1/19)  Do you have a log of your heart rate readings (document readings)?  Yes  Night readings range 37-48 bpm  Do you have any other symptoms?   Some very short episodes of dizziness   Patient is concerned she has been having more frequent low HR readings.  Patient reported having 8-10 episodes of 37-38 bpm per night.

## 2024-03-04 NOTE — Telephone Encounter (Signed)
 Patient also sent MyChart message, responded to patient via MyChart message.

## 2024-06-05 ENCOUNTER — Ambulatory Visit: Admitting: Cardiovascular Disease
